# Patient Record
Sex: Female | Born: 1965
Health system: Southern US, Community
[De-identification: ages and names within clinical notes are randomized; demographics above are authoritative.]

## PROBLEM LIST (undated history)

## (undated) DIAGNOSIS — R011 Cardiac murmur, unspecified: Secondary | ICD-10-CM

## (undated) DIAGNOSIS — T883XXA Malignant hyperthermia due to anesthesia, initial encounter: Secondary | ICD-10-CM

## (undated) DIAGNOSIS — K219 Gastro-esophageal reflux disease without esophagitis: Secondary | ICD-10-CM

## (undated) HISTORY — DX: Cardiac murmur, unspecified: R01.1

## (undated) HISTORY — DX: Malignant hyperthermia due to anesthesia, initial encounter: T88.3XXA

## (undated) HISTORY — PX: BUNIONECTOMY: SHX129

---

## 1998-01-04 ENCOUNTER — Other Ambulatory Visit: Admission: RE | Admit: 1998-01-04 | Discharge: 1998-01-04 | Payer: Self-pay | Admitting: *Deleted

## 1999-09-10 ENCOUNTER — Other Ambulatory Visit: Admission: RE | Admit: 1999-09-10 | Discharge: 1999-09-10 | Payer: Self-pay | Admitting: *Deleted

## 2000-09-10 ENCOUNTER — Other Ambulatory Visit: Admission: RE | Admit: 2000-09-10 | Discharge: 2000-09-10 | Payer: Self-pay | Admitting: *Deleted

## 2001-10-11 ENCOUNTER — Other Ambulatory Visit: Admission: RE | Admit: 2001-10-11 | Discharge: 2001-10-11 | Payer: Self-pay | Admitting: *Deleted

## 2002-10-13 ENCOUNTER — Other Ambulatory Visit: Admission: RE | Admit: 2002-10-13 | Discharge: 2002-10-13 | Payer: Self-pay | Admitting: *Deleted

## 2003-12-26 ENCOUNTER — Ambulatory Visit (HOSPITAL_BASED_OUTPATIENT_CLINIC_OR_DEPARTMENT_OTHER): Admission: RE | Admit: 2003-12-26 | Discharge: 2003-12-26 | Payer: Self-pay | Admitting: Orthopedic Surgery

## 2004-04-09 ENCOUNTER — Other Ambulatory Visit: Admission: RE | Admit: 2004-04-09 | Discharge: 2004-04-09 | Payer: Self-pay | Admitting: Obstetrics and Gynecology

## 2010-01-31 ENCOUNTER — Encounter
Admission: RE | Admit: 2010-01-31 | Discharge: 2010-01-31 | Payer: Self-pay | Source: Home / Self Care | Attending: Obstetrics and Gynecology | Admitting: Obstetrics and Gynecology

## 2010-07-12 NOTE — Op Note (Signed)
[name], Gonzalez              ACCOUNT NO.:  [id number]   MEDICAL RECORD NO.:  [id number]          PATIENT TYPE:  AMB   LOCATION:  DSC                          FACILITY:  [facility]   PHYSICIAN:  Leonides Grills, M.D.     DATE OF BIRTH:  1965-11-09   DATE OF PROCEDURE:  12/26/2003  DATE OF DISCHARGE:                                 OPERATIVE REPORT   PREOPERATIVE DIAGNOSIS:  Bilateral hallux valgus.   POSTOPERATIVE DIAGNOSIS:  Bilateral hallux valgus.   OPERATION:  1.  Bilateral Chevron bunionectomies.  2.  Stress x-rays, bilateral feet.   ANESTHESIA:  General endotracheal.   SURGEON:  Leonides Grills, M.D.   ASSISTANT:  Vallarie Mare, P.A.-C.   ESTIMATED BLOOD LOSS:  Minimal.   TOURNIQUET TIME:  Approximately one-half an hour per side.   COMPLICATIONS:  None.   DISPOSITION:  Stable to the PAR.   INDICATIONS FOR PROCEDURE:  This is a 45 year old female who has had  longstanding progressive great toe pain that was interfering with her life.  __________ despite wearing wider toe-box shoes and taking intermittent anti-  inflammatories.  She has consented for the above procedure.  All risks,  which include infection, nerve or vessel injury, nonunion, malunion,  hardware irritation, hardware failure, recurrence of deformity, stiffness,  arthritis, avascular necrosis of the head, and possible fusion were all  explained.  Questions were encouraged and answered.   DESCRIPTION OF PROCEDURE:  The patient is brought to the operating room and  placed in the supine position.  After adequate general endotracheal tube  anesthesia was administered, as well as Ancef 1 g IV piggyback.  The  bilateral lower extremities were prepped and draped in a sterile manner over  a proximally-placed thigh tourniquet.  We started with the right side and  gravity-exsanguinated the right lower extremity.  The tourniquet was  elevated to 290 mmHg.  A longitudinal incision was then made over the great  toe MTP  joint.  Dissection was carried down through the skin.  Hemostasis  was obtained.  The neurovascular structures were identified both superiorly  and inferiorly and protected throughout the case.  An L-shaped capsulotomy  was then made.  A simple bunionectomy was then performed.  The lateral  capsule was then released with a curved Beaver blade.  A Chevron osteotomy  was then created.  The head was then translated approximately 3 mm to 4 mm  laterally.  This was then fixed with a 2.0 mm fully-threaded cortical set  screw using a 1.5 mm drill hole respectively.  The head was counter-sunk.  This had excellent purchase and maintenance of the correction.  Redundant  bone was then removed with the sagittal saw.  The Rocky Link Johnson's ridge was  then rounded off with the rongeur.  The area was copiously irrigated with  normal saline, including the joint.  The capsule was then advanced both  superiorly and proximally and affixed with #2-0 Vicryl suture.  The  tourniquet was deflated.  Hemostasis was obtained.  The skin was closed with  #4-0 nylon sutures.  A sterile dressing was applied.  A Roger Mann dressing  was applied.   We then performed the same exact procedure on the left side, as described on  the right.  Once the left side was finished and dressings were applied, the  H[place]ell  shoes were applied.  Prior to dressings, also two stress x-rays  were obtained in the AP and lateral planes with the mini-C-arm which showed  excellent placement of fixation, as well as correction, excellent  translation of sesamoids with range of motion as well.  No gross motion at  the osteotomy site, with adequate correction.  After the stress x-rays were  obtained, dressings were applied.  The patient was stable to the PAR.       PB/MEDQ  D:  12/26/2003  T:  12/26/2003  Job:  [id number]

## 2013-03-16 ENCOUNTER — Emergency Department (HOSPITAL_BASED_OUTPATIENT_CLINIC_OR_DEPARTMENT_OTHER)
Admission: EM | Admit: 2013-03-16 | Discharge: 2013-03-16 | Disposition: A | Discharge status: Home / Self Care | Payer: 59 | Attending: Emergency Medicine | Admitting: Emergency Medicine

## 2013-03-16 ENCOUNTER — Encounter (HOSPITAL_BASED_OUTPATIENT_CLINIC_OR_DEPARTMENT_OTHER): Payer: Self-pay | Admitting: Emergency Medicine

## 2013-03-16 DIAGNOSIS — R011 Cardiac murmur, unspecified: Secondary | ICD-10-CM | POA: Insufficient documentation

## 2013-03-16 DIAGNOSIS — R42 Dizziness and giddiness: Secondary | ICD-10-CM | POA: Insufficient documentation

## 2013-03-16 DIAGNOSIS — Z79899 Other long term (current) drug therapy: Secondary | ICD-10-CM | POA: Insufficient documentation

## 2013-03-16 DIAGNOSIS — R002 Palpitations: Secondary | ICD-10-CM | POA: Insufficient documentation

## 2013-03-16 DIAGNOSIS — K219 Gastro-esophageal reflux disease without esophagitis: Secondary | ICD-10-CM | POA: Insufficient documentation

## 2013-03-16 HISTORY — DX: Gastro-esophageal reflux disease without esophagitis: K21.9

## 2013-03-16 LAB — CBC WITH DIFFERENTIAL/PLATELET
BASOS ABS: 0 10*3/uL (ref 0.0–0.1)
BASOS PCT: 0 % (ref 0–1)
EOS ABS: 0.3 10*3/uL (ref 0.0–0.7)
EOS PCT: 3 % (ref 0–5)
HCT: 38.4 % (ref 36.0–46.0)
HEMOGLOBIN: 12.7 g/dL (ref 12.0–15.0)
LYMPHS PCT: 27 % (ref 12–46)
Lymphs Abs: 2.7 10*3/uL (ref 0.7–4.0)
MCH: 29.3 pg (ref 26.0–34.0)
MCHC: 33.1 g/dL (ref 30.0–36.0)
MCV: 88.7 fL (ref 78.0–100.0)
MONO ABS: 1 10*3/uL (ref 0.1–1.0)
Monocytes Relative: 10 % (ref 3–12)
Neutro Abs: 6.2 10*3/uL (ref 1.7–7.7)
Neutrophils Relative %: 61 % (ref 43–77)
Platelets: 311 10*3/uL (ref 150–400)
RBC: 4.33 MIL/uL (ref 3.87–5.11)
RDW: 11.7 % (ref 11.5–15.5)
WBC: 10.2 10*3/uL (ref 4.0–10.5)

## 2013-03-16 LAB — URINALYSIS, ROUTINE W REFLEX MICROSCOPIC
BILIRUBIN URINE: NEGATIVE
Glucose, UA: NEGATIVE mg/dL
KETONES UR: NEGATIVE mg/dL
LEUKOCYTES UA: NEGATIVE
NITRITE: NEGATIVE
PROTEIN: NEGATIVE mg/dL
SPECIFIC GRAVITY, URINE: 1.021 (ref 1.005–1.030)
Urobilinogen, UA: 0.2 mg/dL (ref 0.0–1.0)
pH: 5.5 (ref 5.0–8.0)

## 2013-03-16 LAB — BASIC METABOLIC PANEL
BUN: 18 mg/dL (ref 6–23)
CO2: 25 meq/L (ref 19–32)
Calcium: 9.3 mg/dL (ref 8.4–10.5)
Chloride: 101 mEq/L (ref 96–112)
Creatinine, Ser: 0.7 mg/dL (ref 0.50–1.10)
GFR calc Af Amer: >90 mL/min (ref >90)
GFR calc non Af Amer: >90 mL/min (ref >90)
GLUCOSE: 117 mg/dL — AB (ref 70–99)
POTASSIUM: 3.9 meq/L (ref 3.7–5.3)
SODIUM: 141 meq/L (ref 137–147)

## 2013-03-16 LAB — URINE MICROSCOPIC-ADD ON

## 2013-03-16 LAB — TROPONIN I: Troponin I: <0.3 ng/mL (ref <0.30)

## 2013-03-16 NOTE — ED Notes (Signed)
C/o heart palpitations that started after she woke from sleep around 12 am, denies any CP or SOB, denies diaphoresis.

## 2013-03-16 NOTE — Discharge Instructions (Signed)
Palpitations  A palpitation is the feeling that your heartbeat is irregular or is faster than normal. It may feel like your heart is fluttering or skipping a beat. Palpitations are usually not a serious problem. However, in some cases, you may need further medical evaluation. CAUSES  Palpitations can be caused by:  Smoking.  Caffeine or other stimulants, such as diet pills or energy drinks.  Alcohol.  Stress and anxiety.  Strenuous physical activity.  Fatigue.  Certain medicines.  Heart disease, especially if you have a history of arrhythmias. This includes atrial fibrillation, atrial flutter, or supraventricular tachycardia.  An improperly working pacemaker or defibrillator. DIAGNOSIS  To find the cause of your palpitations, your caregiver will take your history and perform a physical exam. Tests may also be done, including:  Electrocardiography (ECG). This test records the heart's electrical activity.  Cardiac monitoring. This allows your caregiver to monitor your heart rate and rhythm in real time.  Holter monitor. This is a portable device that records your heartbeat and can help diagnose heart arrhythmias. It allows your caregiver to track your heart activity for several days, if needed.  Stress tests by exercise or by giving medicine that makes the heart beat faster. TREATMENT  Treatment of palpitations depends on the cause of your symptoms and can vary greatly. Most cases of palpitations do not require any treatment other than time, relaxation, and monitoring your symptoms. Other causes, such as atrial fibrillation, atrial flutter, or supraventricular tachycardia, usually require further treatment. HOME CARE INSTRUCTIONS   Avoid:  Caffeinated coffee, tea, soft drinks, diet pills, and energy drinks.  Chocolate.  Alcohol.  Stop smoking if you smoke.  Reduce your stress and anxiety. Things that can help you relax include:  A method that measures bodily functions so  you can learn to control them (biofeedback).  Yoga.  Meditation.  Physical activity such as swimming, jogging, or walking.  Get plenty of rest and sleep. SEEK MEDICAL CARE IF:   You continue to have a fast or irregular heartbeat beyond 24 hours.  Your palpitations occur more often. SEEK IMMEDIATE MEDICAL CARE IF:  You develop chest pain or shortness of breath.  You have a severe headache.  You feel dizzy, or you faint. MAKE SURE YOU:  Understand these instructions.  Will watch your condition.  Will get help right away if you are not doing well or get worse. Document Released: 02/08/2000 Document Revised: 06/07/2012 Document Reviewed: 04/11/2011 [facility] Patient Information 2014 [place].  Cardiac Event Monitoring A cardiac event monitor is a small recording device used to help detect abnormal heart rhythms (arrhythmias). The monitor is used to record heart rhythm when noticeable symptoms such as the following occur:  Fast heart beats (palpitations), such as heart racing or fluttering.  Dizziness.  Fainting or lightheadedness.  Unexplained weakness. The monitor is wired to two electrodes placed on your chest. Electrodes are flat, sticky disks that attach to your skin. The monitor can be worn for up to 30 days. You will wear the monitor at all times, except when bathing.  HOW TO USE YOUR CARDIAC EVENT MONITOR A technician will prepare your chest for the electrode placement. The technician will show you how to place the electrodes, how to work the monitor, and how to replace the batteries. Take time to practice using the monitor before you leave the office. Make sure you understand how to send the information from the monitor to your health care provider. This requires a telephone with a landline,  not a cellphone. You need to:  Wear your monitor at all times, except when you are in water:  Do not get the monitor wet.  Take the monitor off when bathing. Do not swim  or use a hot tub with it on.  Keep your skin clean. Do not put body lotion or moisturizer on your chest.  Change the electrodes daily or any time they stop sticking to your skin. You might need to use tape to keep them on.  It is possible that your skin under the electrodes could become irritated. To keep this from happening, try to put the electrodes in slightly different places on your chest. However, they must remain in the area under your left breast and in the upper right section of your chest.  Make sure the monitor is safely clipped to your clothing or in a location close to your body that your health care provider recommends.  Press the button to record when you feel symptoms of heart trouble, such as dizziness, weakness, lightheadedness, palpitations, thumping, shortness of breath, unexplained weakness, or a fluttering or racing heart. The monitor is always on and records what happened slightly before you pressed the button, so do not worry about being too late to get good information.  Keep a diary of your activities, such as walking, doing chores, and taking medicine. It is especially important to note what you were doing when you pushed the button to record your symptoms. This will help your health care provider determine what might be contributing to your symptoms. The information stored in your monitor will be reviewed by your health care provider alongside your diary entries.  Send the recorded information as recommended by your health care provider. It is important to understand that it will take some time for your health care provider to process the results.  Change the batteries as recommended by your health care provider. SEEK IMMEDIATE MEDICAL CARE IF:   You have chest pain.  You have extreme difficulty breathing or shortness of breath.  You develop a very fast heartbeat that persists.  You develop dizziness that does not go away .  You faint or constantly feel you are  about to faint. Document Released: 11/20/2007 Document Revised: 10/13/2012 Document Reviewed: 08/09/2012 [facility] Patient Information 2014 [place], [place].

## 2013-03-16 NOTE — ED Provider Notes (Signed)
CSN: [id number]     Arrival date & time 03/16/13  0050 History   First MD Initiated Contact with Patient 03/16/13 0113     Chief Complaint  Patient presents with  . Palpitations   (Consider location/radiation/quality/duration/timing/severity/associated sxs/prior Treatment) HPI Comments: Patient is a 48 y/o female who present to the ED with cc of palpitations. Pt states that after she woke up around 12:30 am to use the restroom, she started having some palpitations and rapid heart beat. Her symptoms lasted for about 5 minutes and she had associated dizziness. She denies chest pain, dib, syncope. No hx of same in the past, and patient has no hx of CAD, PE and no true risk factors for the either of those 2 conditions. She denis any stimulant use, OTC supplements, smoking. Pt's symptoms have not recurred since they subsided.   Patient is a 48 y.o. female presenting with palpitations. The history is provided by the patient.  Palpitations Associated symptoms: dizziness   Associated symptoms: no chest pain and no shortness of breath     Past Medical History  Diagnosis Date  . GERD (gastroesophageal reflux disease)    History reviewed. No pertinent past surgical history. History reviewed. No pertinent family history. History  Substance Use Topics  . Smoking status: Never Smoker   . Smokeless tobacco: Not on file  . Alcohol Use: No   OB History   Grav Para Term Preterm Abortions TAB SAB Ect Mult Living                 Review of Systems  Constitutional: Positive for activity change.  Respiratory: Negative for shortness of breath.   Cardiovascular: Positive for palpitations. Negative for chest pain.  Gastrointestinal: Negative for abdominal pain.  Genitourinary: Negative for dysuria.  Neurological: Positive for dizziness. Negative for syncope.  All other systems reviewed and are negative.    Allergies  Review of patient's allergies indicates no known allergies.  Home Medications    Current Outpatient Rx  Name  Route  Sig  Dispense  Refill  . pantoprazole (PROTONIX) 40 MG tablet   Oral   Take 40 mg by mouth daily.          BP 152/58  Pulse 72  Temp(Src) 98.5 F (36.9 C) (Oral)  Resp 16  Ht 5' (1.524 m)  Wt 125 lb (56.7 kg)  BMI 24.41 kg/m2  SpO2 100%  LMP 03/07/2013 Physical Exam  Nursing note and vitals reviewed. Constitutional: She appears well-developed.  HENT:  Head: Atraumatic.  Eyes: Conjunctivae are normal.  Neck: Neck supple.  Cardiovascular: Normal rate.   Murmur heard. Pulmonary/Chest: Effort normal and breath sounds normal. No respiratory distress.  Abdominal: Soft. She exhibits no mass.  Neurological: She is alert.  Skin: Skin is warm.    ED Course  Procedures (including critical care time) Labs Review Labs Reviewed  BASIC METABOLIC PANEL - Abnormal; Notable for the following:    Glucose, Bld 117 (*)    All other components within normal limits  URINALYSIS, ROUTINE W REFLEX MICROSCOPIC - Abnormal; Notable for the following:    Hgb urine dipstick MODERATE (*)    All other components within normal limits  CBC WITH DIFFERENTIAL  TROPONIN I  URINE MICROSCOPIC-ADD ON   Imaging Review No results found.  EKG Interpretation   None       MDM   1. Heart palpitations     Date: 03/16/2013  Rate: 70  Rhythm: normal sinus rhythm  QRS Axis: normal  Intervals: normal  ST/T Wave abnormalities: normal  Conduction Disutrbances: none  Narrative Interpretation: unremarkable  Pt comes in with cc of palpitations. She has no Cardiac hx, and is symptoms free at the time of the evaluation. Additionally, there is no hx of PE, DVT and risk factors for the same, no stimulant use, no drug abuse. Will check basic labs. Suspect PSVT at this time. PCP f/u will be requested.    Varney Biles, MD 03/16/13 [phone]

## 2013-03-16 NOTE — ED Notes (Signed)
No old EKG found  

## 2013-09-13 ENCOUNTER — Other Ambulatory Visit: Payer: Self-pay | Admitting: Obstetrics and Gynecology

## 2013-09-13 DIAGNOSIS — R928 Other abnormal and inconclusive findings on diagnostic imaging of breast: Secondary | ICD-10-CM

## 2013-09-19 ENCOUNTER — Ambulatory Visit
Admission: RE | Admit: 2013-09-19 | Discharge: 2013-09-19 | Disposition: A | Discharge status: Home / Self Care | Payer: 59 | Source: Ambulatory Visit | Attending: Obstetrics and Gynecology | Admitting: Obstetrics and Gynecology

## 2013-09-19 DIAGNOSIS — R928 Other abnormal and inconclusive findings on diagnostic imaging of breast: Secondary | ICD-10-CM

## 2013-12-09 ENCOUNTER — Other Ambulatory Visit: Payer: Self-pay

## 2015-04-09 MED FILL — PANTOPRAZOLE SOD DR 40 MG T: 40 | 90 days supply | Qty: 90 | Fill #3

## 2015-07-17 MED FILL — PANTOPRAZOLE SOD DR 40 MG T: 40 | 90 days supply | Qty: 90 | Fill #0

## 2015-07-18 DIAGNOSIS — Z0000 Encounter for general adult medical examination without abnormal findings: Secondary | ICD-10-CM | POA: Diagnosis not present

## 2015-07-18 DIAGNOSIS — K219 Gastro-esophageal reflux disease without esophagitis: Secondary | ICD-10-CM | POA: Diagnosis not present

## 2015-08-22 ENCOUNTER — Encounter (HOSPITAL_BASED_OUTPATIENT_CLINIC_OR_DEPARTMENT_OTHER): Payer: Self-pay

## 2015-08-22 ENCOUNTER — Emergency Department (HOSPITAL_BASED_OUTPATIENT_CLINIC_OR_DEPARTMENT_OTHER)
Admission: EM | Admit: 2015-08-22 | Discharge: 2015-08-23 | Disposition: A | Discharge status: Home / Self Care | Payer: 59 | Attending: Emergency Medicine | Admitting: Emergency Medicine

## 2015-08-22 ENCOUNTER — Emergency Department (HOSPITAL_BASED_OUTPATIENT_CLINIC_OR_DEPARTMENT_OTHER): Payer: 59

## 2015-08-22 DIAGNOSIS — R0789 Other chest pain: Secondary | ICD-10-CM | POA: Diagnosis present

## 2015-08-22 DIAGNOSIS — K219 Gastro-esophageal reflux disease without esophagitis: Secondary | ICD-10-CM | POA: Diagnosis not present

## 2015-08-22 DIAGNOSIS — Z79899 Other long term (current) drug therapy: Secondary | ICD-10-CM | POA: Insufficient documentation

## 2015-08-22 DIAGNOSIS — R079 Chest pain, unspecified: Secondary | ICD-10-CM | POA: Diagnosis not present

## 2015-08-22 LAB — CBC
HCT: 40 % (ref 36.0–46.0)
Hemoglobin: 13.5 g/dL (ref 12.0–15.0)
MCH: 29.3 pg (ref 26.0–34.0)
MCHC: 33.8 g/dL (ref 30.0–36.0)
MCV: 87 fL (ref 78.0–100.0)
PLATELETS: 303 10*3/uL (ref 150–400)
RBC: 4.6 MIL/uL (ref 3.87–5.11)
RDW: 12.2 % (ref 11.5–15.5)
WBC: 8.9 10*3/uL (ref 4.0–10.5)

## 2015-08-22 LAB — BASIC METABOLIC PANEL
Anion gap: 8 (ref 5–15)
BUN: 15 mg/dL (ref 6–20)
CALCIUM: 9.4 mg/dL (ref 8.9–10.3)
CHLORIDE: 104 mmol/L (ref 101–111)
CO2: 26 mmol/L (ref 22–32)
CREATININE: 0.81 mg/dL (ref 0.44–1.00)
GFR calc non Af Amer: >60 mL/min (ref >60)
GLUCOSE: 160 mg/dL — AB (ref 65–99)
Potassium: 3.7 mmol/L (ref 3.5–5.1)
Sodium: 138 mmol/L (ref 135–145)

## 2015-08-22 LAB — TROPONIN I

## 2015-08-22 MED ORDER — GI COCKTAIL ~~LOC~~
30.0000 mL | Freq: Once | ORAL | Status: AC
  Administered 2015-08-22: 30 mL via ORAL
  Filled 2015-08-22: qty 30

## 2015-08-22 NOTE — ED Notes (Signed)
Patient transported to X-ray 

## 2015-08-22 NOTE — ED Notes (Signed)
CP started yesterday-NAD-steady gait

## 2015-08-22 NOTE — ED Notes (Signed)
Pt c/o constant midline chest burning since last night.  She states she felt like it was heart burn, but she tried antacids without relief and she did not sleep at all last night.  She says that tonight she had sudden onset of nausea which she has never had with her heart burn before.  Pt does not have any health problems, she denies any other associated symptoms.

## 2015-08-22 NOTE — ED Notes (Signed)
Pt returned from xray

## 2015-08-22 NOTE — ED Provider Notes (Signed)
CSN: [id number]     Arrival date & time 08/22/15  2233 History  By signing my name below, I, Soijett Blue, attest that this documentation has been prepared under the direction and in the presence of Merryl Hacker, MD. Electronically Signed: Soijett Blue, ED Scribe. 08/22/2015. 11:38 PM.   Chief Complaint  Patient presents with  . Chest Pain      The history is provided by the patient. No language interpreter was used.    HPI Comments: Michaela Gonzalez is a 50 y.o. female with a medical hx of GERD who presents to the Emergency Department complaining of 4/10, constant, burning, sternal, non-radiating, CP onset last night. Pt notes that her reflux is worse after eating late at night. Pain has been constant since yesterday. Pt states that she takes 40 mg protonix daily for her GERD. She notes that she was eating dinner tonight when she had sudden onset of nausea. THis was new. She states that she has tried Mylanta with her last dose at 6:30 PM tonight with no relief for her symptoms. She denies vomiting and any other symptoms. Denies PMHx of DM, HTN, or heart dx. Denies any major medical issues. Pt notes that she does still have her gallbladder. Denies allergies to any medications. Pt notes that she has an appointment with GI specialist in 2 weeks for a colonoscopy.    Past Medical History  Diagnosis Date  . GERD (gastroesophageal reflux disease)    History reviewed. No pertinent past surgical history. No family history on file. Social History  Substance Use Topics  . Smoking status: Never Smoker   . Smokeless tobacco: None  . Alcohol Use: Yes     Comment: rare   OB History    No data available     Review of Systems  Constitutional: Negative for fever.  Cardiovascular: Positive for chest pain.  Gastrointestinal: Positive for nausea. Negative for vomiting and abdominal pain.  All other systems reviewed and are negative.   Allergies  Review of patient's allergies indicates no  known allergies.  Home Medications   Prior to Admission medications   Medication Sig Start Date End Date Taking? Authorizing Provider  famotidine (PEPCID) 20 MG tablet Take 1 tablet (20 mg total) by mouth 2 (two) times daily. 08/23/15   Merryl Hacker, MD  pantoprazole (PROTONIX) 40 MG tablet Take 40 mg by mouth daily.    Historical Provider, MD   BP 129/63 mmHg  Pulse 57  Temp(Src) 98.5 F (36.9 C) (Oral)  Resp 16  Ht 5' (1.524 m)  Wt 127 lb (57.607 kg)  BMI 24.80 kg/m2  SpO2 96% Physical Exam  Constitutional: She is oriented to person, place, and time. She appears well-developed and well-nourished. No distress.  HENT:  Head: Normocephalic and atraumatic.  Cardiovascular: Normal rate, regular rhythm and normal heart sounds.   Pulmonary/Chest: Effort normal. No respiratory distress. She has no wheezes.  Abdominal: Soft. Bowel sounds are normal. There is no tenderness. There is no rebound.  Neurological: She is alert and oriented to person, place, and time.  Skin: Skin is warm and dry.  Psychiatric: She has a normal mood and affect.  Nursing note and vitals reviewed.   ED Course  Procedures (including critical care time) DIAGNOSTIC STUDIES: Oxygen Saturation is 100% on RA, nl by my interpretation.    COORDINATION OF CARE: 11:36 PM Discussed treatment plan with pt at bedside which includes CXR, labs, EKG, and pt agreed to plan.   Labs  Review Labs Reviewed  BASIC METABOLIC PANEL - Abnormal; Notable for the following:    Glucose, Bld 160 (*)    All other components within normal limits  HEPATIC FUNCTION PANEL - Abnormal; Notable for the following:    Bilirubin, Direct <0.1 (*)    All other components within normal limits  CBC  TROPONIN I  LIPASE, BLOOD    Imaging Review Dg Chest 2 View  08/22/2015  CLINICAL DATA:  Midsternal chest pain for 2 days. Nausea today appear EXAM: CHEST  2 VIEW COMPARISON:  07/02/2012 FINDINGS: The heart size and mediastinal contours are  within normal limits. Both lungs are clear. The visualized skeletal structures are unremarkable. IMPRESSION: No active cardiopulmonary disease. Electronically Signed   By: Andreas Newport M.D.   On: 08/22/2015 23:13   I have personally reviewed and evaluated these images and lab results as part of my medical decision-making.   EKG Interpretation   Date/Time:  Wednesday August 22 2015 22:46:25 EDT Ventricular Rate:  72 PR Interval:  128 QRS Duration: 86 QT Interval:  402 QTC Calculation: 440 R Axis:   78 Text Interpretation:  Normal sinus rhythm Normal ECG Confirmed by HORTON   MD, COURTNEY ([id number]) on 08/22/2015 10:56:53 PM      MDM   Final diagnoses:  Gastroesophageal reflux disease, esophagitis presence not specified    Patient presents with chest pain. Onset of symptoms 24 hours ago. It is similar to her known heartburn; however, she reports nausea today which is new. It has been constant. Very atypical for ACS. EKG is normal.  Workup initiated. Patient was given a GI cocktail. Troponin, LFTs, lipase are normal. Patient had improvement with a GI cocktail. Given history, feel this is likely related to the patient's known GERD. She has a GI appointment in 2 weeks to schedule a colonoscopy. I encouraged her to discuss with GI and upper endoscopy as well. Given that her symptoms are somewhat worse, will initiate an H2 blocker in addition to her PPI at home.  She should be reevaluated if she develops bloody emesis or bloody stools. Patient stated understanding.  After history, exam, and medical workup I feel the patient has been appropriately medically screened and is safe for discharge home. Pertinent diagnoses were discussed with the patient. Patient was given return precautions.  I personally performed the services described in this documentation, which was scribed in my presence. The recorded information has been reviewed and is accurate.    Merryl Hacker, MD 08/23/15 [phone]

## 2015-08-23 LAB — HEPATIC FUNCTION PANEL
ALK PHOS: 90 U/L (ref 38–126)
ALT: 17 U/L (ref 14–54)
AST: 22 U/L (ref 15–41)
Albumin: 4.4 g/dL (ref 3.5–5.0)
Bilirubin, Direct: <0.1 mg/dL — ABNORMAL LOW (ref 0.1–0.5)
TOTAL PROTEIN: 7.5 g/dL (ref 6.5–8.1)
Total Bilirubin: 0.5 mg/dL (ref 0.3–1.2)

## 2015-08-23 LAB — LIPASE, BLOOD: Lipase: 33 U/L (ref 11–51)

## 2015-08-23 MED ORDER — FAMOTIDINE 20 MG PO TABS
20.0000 mg | ORAL_TABLET | Freq: Two times a day (BID) | ORAL | Status: AC
Start: 2015-08-23 — End: ?

## 2015-08-23 NOTE — Discharge Instructions (Signed)

## 2015-08-23 NOTE — ED Notes (Signed)
Pt verbalizes understanding of d/c instructions and denies any further needs at this time. 

## 2015-08-24 MED FILL — FAMOTIDINE 20 MG TABLET: 20 | 15 days supply | Qty: 30 | Fill #0

## 2015-09-07 DIAGNOSIS — K219 Gastro-esophageal reflux disease without esophagitis: Secondary | ICD-10-CM | POA: Diagnosis not present

## 2015-10-17 MED FILL — PANTOPRAZOLE SOD DR 40 MG T: 40 | 90 days supply | Qty: 90 | Fill #0

## 2015-10-18 ENCOUNTER — Telehealth: Payer: Self-pay | Admitting: Gastroenterology

## 2015-10-19 DIAGNOSIS — Z1231 Encounter for screening mammogram for malignant neoplasm of breast: Secondary | ICD-10-CM | POA: Diagnosis not present

## 2015-10-19 DIAGNOSIS — R0981 Nasal congestion: Secondary | ICD-10-CM | POA: Diagnosis not present

## 2015-10-19 DIAGNOSIS — Z01419 Encounter for gynecological examination (general) (routine) without abnormal findings: Secondary | ICD-10-CM | POA: Diagnosis not present

## 2015-10-19 DIAGNOSIS — H9319 Tinnitus, unspecified ear: Secondary | ICD-10-CM | POA: Diagnosis not present

## 2015-10-19 DIAGNOSIS — Z6825 Body mass index (BMI) 25.0-25.9, adult: Secondary | ICD-10-CM | POA: Diagnosis not present

## 2015-10-19 DIAGNOSIS — Z780 Asymptomatic menopausal state: Secondary | ICD-10-CM | POA: Diagnosis not present

## 2015-10-19 MED FILL — FLUTICASONE PROP 50 MCG SPR: 50 | 60 days supply | Qty: 16 | Fill #0

## 2015-10-22 ENCOUNTER — Encounter: Payer: Self-pay | Admitting: Gastroenterology

## 2015-10-22 NOTE — Telephone Encounter (Signed)
Dr. Loletha Carrow reviewed records and has accepted patient. Ok to schedule Direct EGD/Colon. Left message for patient to return my call

## 2015-10-22 NOTE — Telephone Encounter (Signed)
EGD and colon has been scheduled.

## 2015-10-23 ENCOUNTER — Other Ambulatory Visit: Payer: Self-pay | Admitting: Obstetrics and Gynecology

## 2015-10-23 DIAGNOSIS — R928 Other abnormal and inconclusive findings on diagnostic imaging of breast: Secondary | ICD-10-CM

## 2015-10-24 DIAGNOSIS — R05 Cough: Secondary | ICD-10-CM | POA: Diagnosis not present

## 2015-10-24 DIAGNOSIS — J329 Chronic sinusitis, unspecified: Secondary | ICD-10-CM | POA: Diagnosis not present

## 2015-10-24 MED FILL — HYDROCODONE-HOMATROPINE SYR: 5-1.5 | 6 days supply | Qty: 120 | Fill #0

## 2015-10-24 MED FILL — SULFAMETHOXAZOLE/TMP DS TAB: 800-160 | 10 days supply | Qty: 20 | Fill #0

## 2015-10-31 ENCOUNTER — Ambulatory Visit
Admission: RE | Admit: 2015-10-31 | Discharge: 2015-10-31 | Disposition: A | Discharge status: Home / Self Care | Payer: 59 | Source: Ambulatory Visit | Attending: Obstetrics and Gynecology | Admitting: Obstetrics and Gynecology

## 2015-10-31 DIAGNOSIS — R921 Mammographic calcification found on diagnostic imaging of breast: Secondary | ICD-10-CM | POA: Diagnosis not present

## 2015-10-31 DIAGNOSIS — R928 Other abnormal and inconclusive findings on diagnostic imaging of breast: Secondary | ICD-10-CM

## 2015-11-02 MED FILL — ZOLPIDEM TARTRATE 10 MG TAB: 10 | 30 days supply | Qty: 30 | Fill #0

## 2015-11-14 ENCOUNTER — Ambulatory Visit (AMBULATORY_SURGERY_CENTER): Payer: Self-pay | Admitting: *Deleted

## 2015-11-14 VITALS — Ht 60.25 in | Wt 129.0 lb

## 2015-11-14 DIAGNOSIS — Z1211 Encounter for screening for malignant neoplasm of colon: Secondary | ICD-10-CM

## 2015-11-14 DIAGNOSIS — K219 Gastro-esophageal reflux disease without esophagitis: Secondary | ICD-10-CM

## 2015-11-14 MED ORDER — NA SULFATE-K SULFATE-MG SULF 17.5-3.13-1.6 GM/177ML PO SOLN: 1.0000 | Freq: Once | ORAL | 0 refills | Status: AC

## 2015-11-14 NOTE — Progress Notes (Signed)
No egg or soy allergy known to patient  No issues with past sedation with any surgeries  or procedures, no intubation problems  No diet pills per patient No home 02 use per patient  No blood thinners per patient  Pt denies issues with constipation  No A fib or A flutter   emmi declined'   

## 2015-11-19 MED FILL — SUPREP BOWEL PREP KIT: 17.5-3.13-1 | 1 days supply | Qty: 354 | Fill #0

## 2015-12-03 ENCOUNTER — Encounter: Payer: Self-pay | Admitting: Gastroenterology

## 2015-12-03 ENCOUNTER — Ambulatory Visit (AMBULATORY_SURGERY_CENTER): Payer: 59 | Admitting: Gastroenterology

## 2015-12-03 VITALS — BP 124/62 | HR 52 | Temp 99.3°F | Resp 15 | Ht 60.0 in | Wt 129.0 lb

## 2015-12-03 DIAGNOSIS — Z1211 Encounter for screening for malignant neoplasm of colon: Secondary | ICD-10-CM

## 2015-12-03 DIAGNOSIS — R12 Heartburn: Secondary | ICD-10-CM

## 2015-12-03 DIAGNOSIS — D122 Benign neoplasm of ascending colon: Secondary | ICD-10-CM | POA: Diagnosis not present

## 2015-12-03 DIAGNOSIS — Z1212 Encounter for screening for malignant neoplasm of rectum: Secondary | ICD-10-CM | POA: Diagnosis not present

## 2015-12-03 DIAGNOSIS — K219 Gastro-esophageal reflux disease without esophagitis: Secondary | ICD-10-CM | POA: Diagnosis not present

## 2015-12-03 MED ORDER — SODIUM CHLORIDE 0.9 % IV SOLN: 500.0000 mL | INTRAVENOUS | Status: AC

## 2015-12-03 NOTE — Op Note (Signed)
[place] Patient Name: Michaela Gonzalez Procedure Date: 12/03/2015 2:34 PM MRN: [id number] Endoscopist: Mallie Mussel L. Loletha Gonzalez , MD Age: 50 Referring MD:  Date of Birth: [birth year] Gender: Female Account #: [id number] Procedure:                Colonoscopy Indications:              Screening for colorectal malignant neoplasm, This                            is the patient's first colonoscopy Medicines:                Monitored Anesthesia Care Procedure:                Pre-Anesthesia Assessment:                           - Prior to the procedure, a History and Physical                            was performed, and patient medications and                            allergies were reviewed. The patient's tolerance of                            previous anesthesia was also reviewed. The risks                            and benefits of the procedure and the sedation                            options and risks were discussed with the patient.                            All questions were answered, and informed consent                            was obtained. Prior Anticoagulants: The patient has                            taken no previous anticoagulant or antiplatelet                            agents. ASA Grade Assessment: II - A patient with                            mild systemic disease. After reviewing the risks                            and benefits, the patient was deemed in                            satisfactory condition to undergo the procedure.  After obtaining informed consent, the colonoscope                            was passed under direct vision. Throughout the                            procedure, the patient's blood pressure, pulse, and                            oxygen saturations were monitored continuously. The                            Model CF-HQ190L [id number]) scope was introduced                            through the anus and  advanced to the the cecum,                            identified by appendiceal orifice and ileocecal                            valve. The colonoscopy was performed with                            difficulty due to a tortuous colon (splenic                            flexure). Successful completion of the procedure                            was aided by withdrawing the scope and replacing                            with the pediatric colonoscope. The patient                            tolerated the procedure well. The quality of the                            bowel preparation was excellent. The ileocecal                            valve, appendiceal orifice, and rectum were                            photographed. The quality of the bowel preparation                            was evaluated using the BBPS [facility] Bowel                            Preparation Scale) with scores of: Right Colon = 3,  Transverse Colon = 3 and Left Colon = 3 (entire                            mucosa seen well with no residual staining, small                            fragments of stool or opaque liquid). The total                            BBPS score equals 9. The bowel preparation used was                            SUPREP. Scope In: 2:52:07 PM Scope Out: 3:18:23 PM Scope Withdrawal Time: 0 hours 9 minutes 36 seconds  Total Procedure Duration: 0 hours 26 minutes 16 seconds  Findings:                 The perianal and digital rectal examinations were                            normal.                           A 4 mm polyp was found in the proximal ascending                            colon. The polyp was sessile. The polyp was removed                            with a cold snare. Resection and retrieval were                            complete.                           The exam was otherwise without abnormality on                            direct and retroflexion  views. Complications:            No immediate complications. Estimated Blood Loss:     Estimated blood loss: none. Impression:               - One 4 mm polyp in the proximal ascending colon,                            removed with a cold snare. Resected and retrieved.                           - The examination was otherwise normal on direct                            and retroflexion views. Recommendation:           - Patient has a contact number available for  emergencies. The signs and symptoms of potential                            delayed complications were discussed with the                            patient. Return to normal activities tomorrow.                            Written discharge instructions were provided to the                            patient.                           - Resume previous diet.                           - Continue present medications.                           - Await pathology results.                           - Repeat colonoscopy is recommended for                            surveillance. The colonoscopy date will be                            determined after pathology results from today's                            exam become available for review. Michaela Lerner L. Loletha Carrow, MD 12/03/2015 3:24:21 PM This report has been signed electronically.

## 2015-12-03 NOTE — Patient Instructions (Signed)
  HANDOUT GIVEN FOR ANTIREFLUX AND POLYPS.  YOU HAD AN ENDOSCOPIC PROCEDURE TODAY AT [facility]:   Refer to the procedure report that was given to you for any specific questions about what was found during the examination.  If the procedure report does not answer your questions, please call your gastroenterologist to clarify.  If you requested that your care partner not be given the details of your procedure findings, then the procedure report has been included in a sealed envelope for you to review at your convenience later.  YOU SHOULD EXPECT: Some feelings of bloating in the abdomen. Passage of more gas than usual.  Walking can help get rid of the air that was put into your GI tract during the procedure and reduce the bloating. If you had a lower endoscopy (such as a colonoscopy or flexible sigmoidoscopy) you may notice spotting of blood in your stool or on the toilet paper. If you underwent a bowel prep for your procedure, you may not have a normal bowel movement for a few days.  Please Note:  You might notice some irritation and congestion in your nose or some drainage.  This is from the oxygen used during your procedure.  There is no need for concern and it should clear up in a day or so.  SYMPTOMS TO REPORT IMMEDIATELY:   Following lower endoscopy (colonoscopy or flexible sigmoidoscopy):  Excessive amounts of blood in the stool  Significant tenderness or worsening of abdominal pains  Swelling of the abdomen that is new, acute  Fever of 100F or higher   Following upper endoscopy (EGD)  Vomiting of blood or coffee ground material  New chest pain or pain under the shoulder blades  Painful or persistently difficult swallowing  New shortness of breath  Fever of 100F or higher  Black, tarry-looking stools  For urgent or emergent issues, a gastroenterologist can be reached at any hour by calling [phone].   DIET:  We do recommend a small meal at first, but then  you may proceed to your regular diet.  Drink plenty of fluids but you should avoid alcoholic beverages for 24 hours.  ACTIVITY:  You should plan to take it easy for the rest of today and you should NOT DRIVE or use heavy machinery until tomorrow (because of the sedation medicines used during the test).    FOLLOW UP: Our staff will call the number listed on your records the next business day following your procedure to check on you and address any questions or concerns that you may have regarding the information given to you following your procedure. If we do not reach you, we will leave a message.  However, if you are feeling well and you are not experiencing any problems, there is no need to return our call.  We will assume that you have returned to your regular daily activities without incident.  If any biopsies were taken you will be contacted by phone or by letter within the next 1-3 weeks.  Please call us at [phone] if you have not heard about the biopsies in 3 weeks.    SIGNATURES/CONFIDENTIALITY: You and/or your care partner have signed paperwork which will be entered into your electronic medical record.  These signatures attest to the fact that that the information above on your After Visit Summary has been reviewed and is understood.  Full responsibility of the confidentiality of this discharge information lies with you and/or your care-partner.

## 2015-12-03 NOTE — Progress Notes (Signed)
Report to PACU, RN, vss, BBS= Clear.  

## 2015-12-03 NOTE — Op Note (Signed)
[place] Patient Name: Michaela Gonzalez Procedure Date: 12/03/2015 2:34 PM MRN: [id number] Endoscopist: Mallie Mussel L. Loletha Carrow , MD Age: 50 Referring MD:  Date of Birth: 11-Jan-1966 Gender: Female Account #: [id number] Procedure:                Upper GI endoscopy Indications:              Heartburn Medicines:                Monitored Anesthesia Care Procedure:                Pre-Anesthesia Assessment:                           - Prior to the procedure, a History and Physical                            was performed, and patient medications and                            allergies were reviewed. The patient's tolerance of                            previous anesthesia was also reviewed. The risks                            and benefits of the procedure and the sedation                            options and risks were discussed with the patient.                            All questions were answered, and informed consent                            was obtained. Prior Anticoagulants: The patient has                            taken no previous anticoagulant or antiplatelet                            agents. ASA Grade Assessment: II - A patient with                            mild systemic disease. After reviewing the risks                            and benefits, the patient was deemed in                            satisfactory condition to undergo the procedure.                           After obtaining informed consent, the endoscope was  passed under direct vision. Throughout the                            procedure, the patient's blood pressure, pulse, and                            oxygen saturations were monitored continuously. The                            Model GIF-HQ190 [id number]) scope was introduced                            through the mouth, and advanced to the second part                            of duodenum. The upper GI endoscopy was                          accomplished without difficulty. The patient                            tolerated the procedure well. Scope In: 2:52:49 PM Scope Out: 2:55:45 PM Total Procedure Duration: 0 hours 2 minutes 56 seconds  Findings:                 The esophagus was normal.                           The stomach was normal.                           The cardia and gastric fundus were normal on                            retroflexion.                           The examined duodenum was normal. Complications:            No immediate complications. Estimated Blood Loss:     Estimated blood loss: none. Impression:               - Normal esophagus.                           - Normal stomach.                           - Normal examined duodenum.                           - No specimens collected. Recommendation:           - Patient has a contact number available for                            emergencies. The signs and symptoms of potential  delayed complications were discussed with the                            patient. Return to normal activities tomorrow.                            Written discharge instructions were provided to the                            patient.                           - Resume previous diet.                           - Continue present medications.                           - Follow an antireflux regimen.                           - See the other procedure note for documentation of                            additional recommendations. Derrick Tiegs L. Loletha Carrow, MD 12/03/2015 3:21:35 PM This report has been signed electronically.

## 2015-12-03 NOTE — Progress Notes (Signed)
Called to room to assist during endoscopic procedure.  Patient ID and intended procedure confirmed with present staff. Received instructions for my participation in the procedure from the performing physician.  

## 2015-12-04 ENCOUNTER — Other Ambulatory Visit: Payer: Self-pay | Admitting: Obstetrics and Gynecology

## 2015-12-04 ENCOUNTER — Telehealth: Payer: Self-pay

## 2015-12-04 DIAGNOSIS — R921 Mammographic calcification found on diagnostic imaging of breast: Secondary | ICD-10-CM

## 2015-12-04 NOTE — Telephone Encounter (Signed)
  Follow up Call-  Call back number 12/03/2015  Post procedure Call Back phone  # [phone] cell  Permission to leave phone message Yes  Some recent data might be hidden     Patient questions:  Do you have a fever, pain , or abdominal swelling? No. Pain Score  0 *  Have you tolerated food without any problems? Yes.    Have you been able to return to your normal activities? Yes.    Do you have any questions about your discharge instructions: Diet   No. Medications  No. Follow up visit  No.  Do you have questions or concerns about your Care? No.  Actions: * If pain score is 4 or above: No action needed, pain <4.

## 2015-12-07 ENCOUNTER — Encounter: Payer: Self-pay | Admitting: Gastroenterology

## 2016-01-07 DIAGNOSIS — M79641 Pain in right hand: Secondary | ICD-10-CM | POA: Diagnosis not present

## 2016-01-07 DIAGNOSIS — M65341 Trigger finger, right ring finger: Secondary | ICD-10-CM | POA: Diagnosis not present

## 2016-01-28 MED FILL — PANTOPRAZOLE SOD DR 40 MG T: 40 | 90 days supply | Qty: 90 | Fill #1

## 2016-02-01 DIAGNOSIS — M65341 Trigger finger, right ring finger: Secondary | ICD-10-CM | POA: Diagnosis not present

## 2016-02-01 DIAGNOSIS — M79644 Pain in right finger(s): Secondary | ICD-10-CM | POA: Diagnosis not present

## 2016-02-08 DIAGNOSIS — H40013 Open angle with borderline findings, low risk, bilateral: Secondary | ICD-10-CM | POA: Diagnosis not present

## 2016-02-08 DIAGNOSIS — H04123 Dry eye syndrome of bilateral lacrimal glands: Secondary | ICD-10-CM | POA: Diagnosis not present

## 2016-04-02 ENCOUNTER — Other Ambulatory Visit: Payer: Self-pay | Admitting: Obstetrics and Gynecology

## 2016-04-02 ENCOUNTER — Other Ambulatory Visit: Payer: Self-pay | Admitting: Physician Assistant

## 2016-04-02 ENCOUNTER — Ambulatory Visit
Admission: RE | Admit: 2016-04-02 | Discharge: 2016-04-02 | Disposition: A | Discharge status: Home / Self Care | Payer: 59 | Source: Ambulatory Visit | Attending: Obstetrics and Gynecology | Admitting: Obstetrics and Gynecology

## 2016-04-02 ENCOUNTER — Ambulatory Visit
Admission: RE | Admit: 2016-04-02 | Discharge: 2016-04-02 | Disposition: A | Discharge status: Home / Self Care | Payer: 59 | Source: Ambulatory Visit | Attending: Physician Assistant | Admitting: Physician Assistant

## 2016-04-02 DIAGNOSIS — R921 Mammographic calcification found on diagnostic imaging of breast: Secondary | ICD-10-CM | POA: Diagnosis not present

## 2016-04-02 DIAGNOSIS — M79671 Pain in right foot: Secondary | ICD-10-CM | POA: Diagnosis not present

## 2016-04-02 DIAGNOSIS — S99921A Unspecified injury of right foot, initial encounter: Secondary | ICD-10-CM | POA: Diagnosis not present

## 2016-04-02 DIAGNOSIS — T1490XA Injury, unspecified, initial encounter: Secondary | ICD-10-CM

## 2016-05-08 MED FILL — PANTOPRAZOLE SOD DR 40 MG T: 40 | 90 days supply | Qty: 90 | Fill #2

## 2016-06-30 DIAGNOSIS — M65341 Trigger finger, right ring finger: Secondary | ICD-10-CM | POA: Diagnosis not present

## 2016-06-30 DIAGNOSIS — M79641 Pain in right hand: Secondary | ICD-10-CM | POA: Diagnosis not present

## 2016-07-28 DIAGNOSIS — Z0000 Encounter for general adult medical examination without abnormal findings: Secondary | ICD-10-CM | POA: Diagnosis not present

## 2016-07-28 DIAGNOSIS — K219 Gastro-esophageal reflux disease without esophagitis: Secondary | ICD-10-CM | POA: Diagnosis not present

## 2016-07-28 DIAGNOSIS — Z13220 Encounter for screening for lipoid disorders: Secondary | ICD-10-CM | POA: Diagnosis not present

## 2016-07-28 DIAGNOSIS — Z131 Encounter for screening for diabetes mellitus: Secondary | ICD-10-CM | POA: Diagnosis not present

## 2016-08-12 MED FILL — PANTOPRAZOLE SOD DR 40 MG T: 40 | 90 days supply | Qty: 90 | Fill #0

## 2016-08-20 ENCOUNTER — Other Ambulatory Visit: Payer: Self-pay | Admitting: Obstetrics and Gynecology

## 2016-08-20 DIAGNOSIS — R921 Mammographic calcification found on diagnostic imaging of breast: Secondary | ICD-10-CM

## 2016-10-29 ENCOUNTER — Ambulatory Visit
Admission: RE | Admit: 2016-10-29 | Discharge: 2016-10-29 | Disposition: A | Discharge status: Home / Self Care | Payer: 59 | Source: Ambulatory Visit | Attending: Obstetrics and Gynecology | Admitting: Obstetrics and Gynecology

## 2016-10-29 ENCOUNTER — Other Ambulatory Visit: Payer: Self-pay | Admitting: Obstetrics and Gynecology

## 2016-10-29 DIAGNOSIS — R921 Mammographic calcification found on diagnostic imaging of breast: Secondary | ICD-10-CM

## 2016-11-04 MED FILL — CEPHALEXIN 500 MG CAPSULE: 500 | 5 days supply | Qty: 20 | Fill #0

## 2016-11-06 DIAGNOSIS — Z6826 Body mass index (BMI) 26.0-26.9, adult: Secondary | ICD-10-CM | POA: Diagnosis not present

## 2016-11-06 DIAGNOSIS — Z01419 Encounter for gynecological examination (general) (routine) without abnormal findings: Secondary | ICD-10-CM | POA: Diagnosis not present

## 2016-11-19 MED FILL — PANTOPRAZOLE SOD DR 40 MG T: 40 | 90 days supply | Qty: 90 | Fill #1

## 2016-12-04 DIAGNOSIS — M65341 Trigger finger, right ring finger: Secondary | ICD-10-CM | POA: Diagnosis not present

## 2016-12-04 MED FILL — HYDROCODON-APAP 5-325: 5-325 | 3 days supply | Qty: 30 | Fill #0

## 2016-12-12 DIAGNOSIS — M79641 Pain in right hand: Secondary | ICD-10-CM | POA: Diagnosis not present

## 2016-12-12 DIAGNOSIS — M65341 Trigger finger, right ring finger: Secondary | ICD-10-CM | POA: Diagnosis not present

## 2017-01-31 DIAGNOSIS — M25512 Pain in left shoulder: Secondary | ICD-10-CM | POA: Diagnosis not present

## 2017-02-12 DIAGNOSIS — H524 Presbyopia: Secondary | ICD-10-CM | POA: Diagnosis not present

## 2017-02-20 MED FILL — PANTOPRAZOLE SOD DR 40 MG T: 40 | 90 days supply | Qty: 90 | Fill #2

## 2017-03-09 DIAGNOSIS — M25512 Pain in left shoulder: Secondary | ICD-10-CM | POA: Diagnosis not present

## 2017-04-29 ENCOUNTER — Other Ambulatory Visit: Payer: Self-pay | Admitting: Obstetrics and Gynecology

## 2017-04-29 ENCOUNTER — Ambulatory Visit
Admission: RE | Admit: 2017-04-29 | Discharge: 2017-04-29 | Disposition: A | Discharge status: Home / Self Care | Payer: 59 | Source: Ambulatory Visit | Attending: Obstetrics and Gynecology | Admitting: Obstetrics and Gynecology

## 2017-04-29 DIAGNOSIS — R921 Mammographic calcification found on diagnostic imaging of breast: Secondary | ICD-10-CM

## 2017-05-29 MED FILL — PANTOPRAZOLE SOD DR 40 MG T: 40 | 90 days supply | Qty: 90 | Fill #0

## 2017-06-28 IMAGING — MG DIGITAL DIAGNOSTIC UNILATERAL LEFT MAMMOGRAM
3 series · 3 of 3 positions shown · non-contrast
Comparison: Previous exam(s).

CLINICAL DATA: Left breast calcifications seen on most recent
screening mammography.

EXAM:
DIGITAL DIAGNOSTIC LEFT MAMMOGRAM WITH CAD

[L CC]
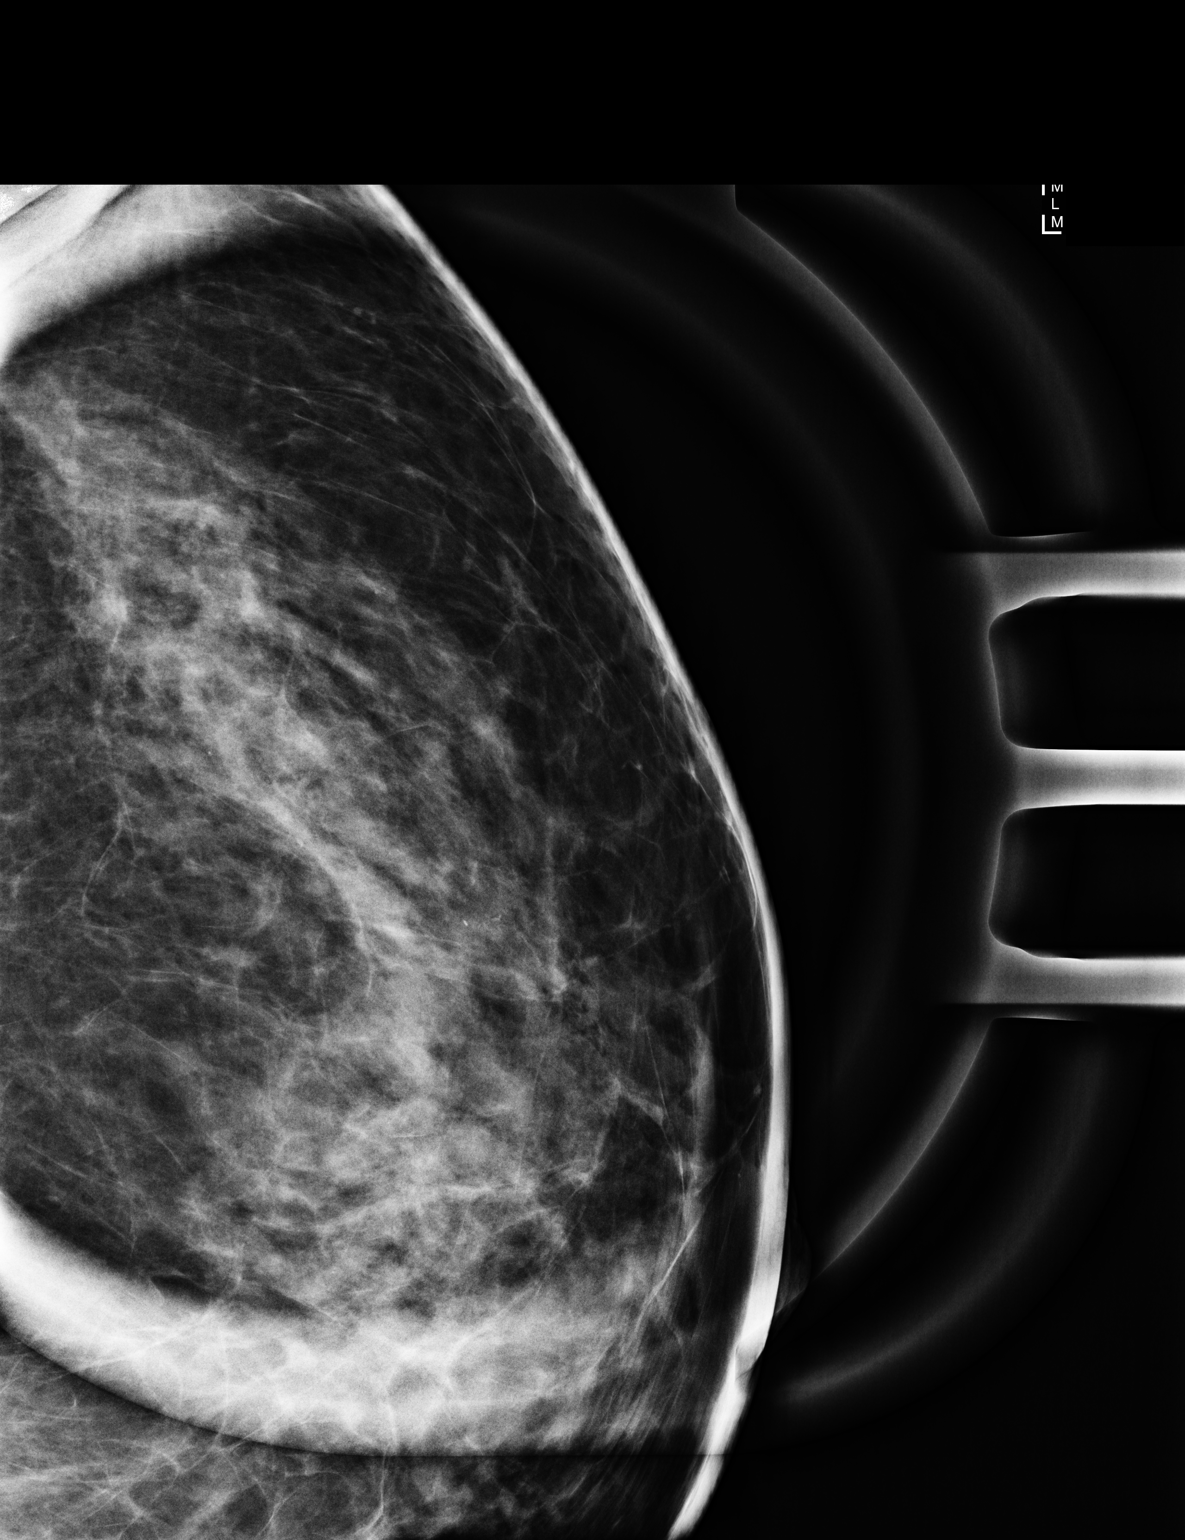

[L ML (1 of 2)]
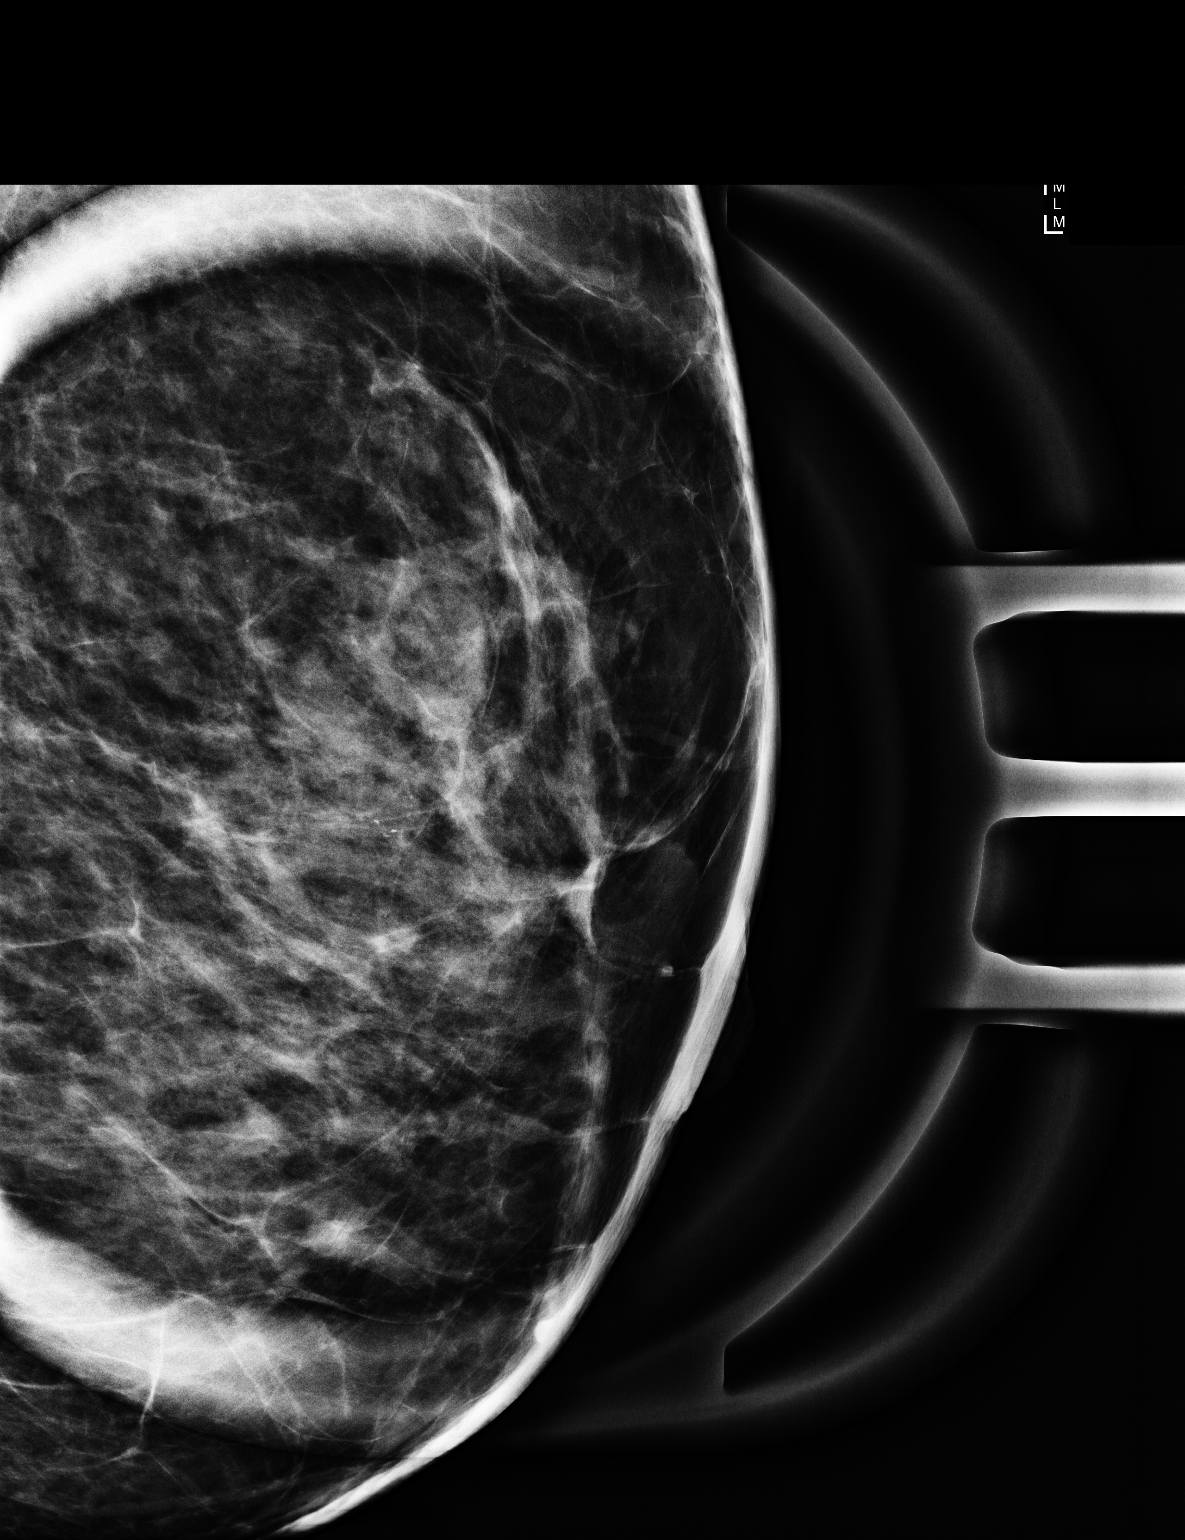

[L ML (2 of 2)]
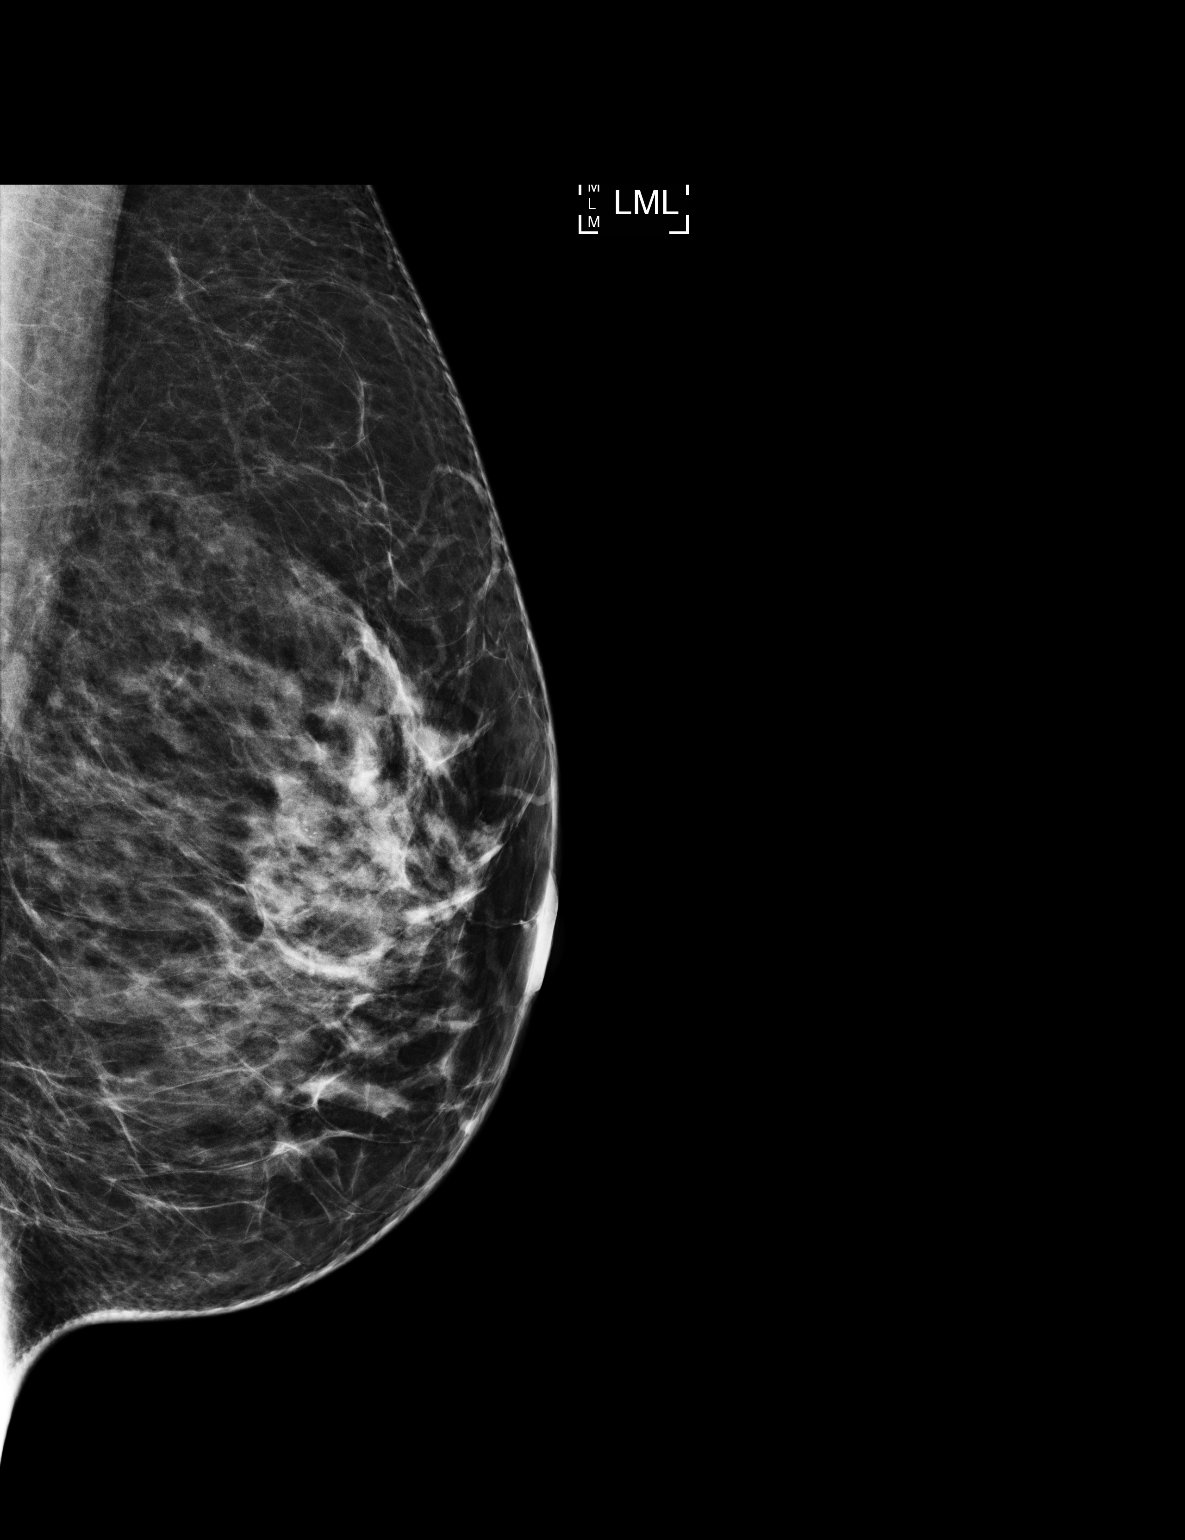

[3 of 3 positions shown; findings below may reference images not displayed]

ACR Breast Density Category c: The breast tissue is heterogeneously
dense, which may obscure small masses.
FINDINGS: Compression magnification views of the left breast demonstrate 5 mm
group of benign-appearing calcifications in the left breast upper
outer quadrant, anterior depth. No associated mass.

Mammographic images were processed with CAD.
IMPRESSION: Probably benign group of calcifications in the left breast upper
outer quadrant, for which six-month follow-up is recommended.

RECOMMENDATION:
Diagnostic mammogram of the left breast in 6 months. (Code:[id number])

I have discussed the findings and recommendations with the patient.
Results were also provided in writing at the conclusion of the
visit. If applicable, a reminder letter will be sent to the patient
regarding the next appointment.

BI-RADS CATEGORY  3: Probably benign finding(s) - short interval
follow-up suggested.

## 2017-07-27 ENCOUNTER — Ambulatory Visit: Payer: Self-pay

## 2017-07-27 ENCOUNTER — Other Ambulatory Visit: Payer: Self-pay | Admitting: Occupational Medicine

## 2017-07-27 DIAGNOSIS — M25562 Pain in left knee: Secondary | ICD-10-CM

## 2017-08-05 DIAGNOSIS — Z13220 Encounter for screening for lipoid disorders: Secondary | ICD-10-CM | POA: Diagnosis not present

## 2017-08-05 DIAGNOSIS — Z136 Encounter for screening for cardiovascular disorders: Secondary | ICD-10-CM | POA: Diagnosis not present

## 2017-08-05 DIAGNOSIS — K219 Gastro-esophageal reflux disease without esophagitis: Secondary | ICD-10-CM | POA: Diagnosis not present

## 2017-08-05 DIAGNOSIS — Z0000 Encounter for general adult medical examination without abnormal findings: Secondary | ICD-10-CM | POA: Diagnosis not present

## 2017-09-09 MED FILL — PANTOPRAZOLE SOD DR 40 MG T: 40 | 90 days supply | Qty: 90 | Fill #0

## 2017-11-09 ENCOUNTER — Ambulatory Visit
Admission: RE | Admit: 2017-11-09 | Discharge: 2017-11-09 | Disposition: A | Discharge status: Home / Self Care | Payer: 59 | Source: Ambulatory Visit | Attending: Obstetrics and Gynecology | Admitting: Obstetrics and Gynecology

## 2017-11-09 DIAGNOSIS — R921 Mammographic calcification found on diagnostic imaging of breast: Secondary | ICD-10-CM | POA: Diagnosis not present

## 2017-11-09 DIAGNOSIS — Z6826 Body mass index (BMI) 26.0-26.9, adult: Secondary | ICD-10-CM | POA: Diagnosis not present

## 2017-11-09 DIAGNOSIS — Z01419 Encounter for gynecological examination (general) (routine) without abnormal findings: Secondary | ICD-10-CM | POA: Diagnosis not present

## 2017-12-01 MED FILL — PANTOPRAZOLE SOD DR 40 MG T: 40 | 90 days supply | Qty: 90 | Fill #1

## 2018-01-04 MED FILL — ACYCLOVIR 5% OINTMENT: 5 | 7 days supply | Qty: 15 | Fill #0

## 2018-03-01 DIAGNOSIS — H31001 Unspecified chorioretinal scars, right eye: Secondary | ICD-10-CM | POA: Diagnosis not present

## 2018-03-01 DIAGNOSIS — H52223 Regular astigmatism, bilateral: Secondary | ICD-10-CM | POA: Diagnosis not present

## 2018-03-01 DIAGNOSIS — H5213 Myopia, bilateral: Secondary | ICD-10-CM | POA: Diagnosis not present

## 2018-03-01 DIAGNOSIS — H524 Presbyopia: Secondary | ICD-10-CM | POA: Diagnosis not present

## 2018-03-01 DIAGNOSIS — H0014 Chalazion left upper eyelid: Secondary | ICD-10-CM | POA: Diagnosis not present

## 2018-03-11 DIAGNOSIS — H0014 Chalazion left upper eyelid: Secondary | ICD-10-CM | POA: Diagnosis not present

## 2018-03-11 MED FILL — TOBRAMYCIN 0.3 % SOLN: 0.3 | 25 days supply | Qty: 5 | Fill #0

## 2018-08-24 DIAGNOSIS — Z13220 Encounter for screening for lipoid disorders: Secondary | ICD-10-CM | POA: Diagnosis not present

## 2018-08-24 DIAGNOSIS — Z0000 Encounter for general adult medical examination without abnormal findings: Secondary | ICD-10-CM | POA: Diagnosis not present

## 2018-08-24 DIAGNOSIS — K219 Gastro-esophageal reflux disease without esophagitis: Secondary | ICD-10-CM | POA: Diagnosis not present

## 2018-08-24 MED FILL — PANTOPRAZOLE SOD DR 40 MG T: 40 | 90 days supply | Qty: 90 | Fill #0

## 2018-11-03 MED FILL — ACYCLOVIR 5% OINTMENT: 5 | 14 days supply | Qty: 30 | Fill #1

## 2018-11-29 DIAGNOSIS — Z01419 Encounter for gynecological examination (general) (routine) without abnormal findings: Secondary | ICD-10-CM | POA: Diagnosis not present

## 2018-11-29 DIAGNOSIS — Z1231 Encounter for screening mammogram for malignant neoplasm of breast: Secondary | ICD-10-CM | POA: Diagnosis not present

## 2018-11-29 DIAGNOSIS — Z6826 Body mass index (BMI) 26.0-26.9, adult: Secondary | ICD-10-CM | POA: Diagnosis not present

## 2018-12-08 ENCOUNTER — Other Ambulatory Visit: Payer: Self-pay | Admitting: Family Medicine

## 2018-12-08 ENCOUNTER — Ambulatory Visit: Payer: Self-pay

## 2018-12-08 ENCOUNTER — Other Ambulatory Visit: Payer: Self-pay

## 2018-12-08 DIAGNOSIS — M25521 Pain in right elbow: Secondary | ICD-10-CM

## 2018-12-08 DIAGNOSIS — M25531 Pain in right wrist: Secondary | ICD-10-CM

## 2018-12-16 MED FILL — PANTOPRAZOLE SOD DR 40 MG T: 40 | 90 days supply | Qty: 90 | Fill #1

## 2018-12-20 MED FILL — ACYCLOVIR 5% OINTMENT: 5 | 7 days supply | Qty: 15 | Fill #0

## 2019-03-07 DIAGNOSIS — H52221 Regular astigmatism, right eye: Secondary | ICD-10-CM | POA: Diagnosis not present

## 2019-03-07 DIAGNOSIS — H524 Presbyopia: Secondary | ICD-10-CM | POA: Diagnosis not present

## 2019-03-07 DIAGNOSIS — H31001 Unspecified chorioretinal scars, right eye: Secondary | ICD-10-CM | POA: Diagnosis not present

## 2019-03-07 DIAGNOSIS — H5213 Myopia, bilateral: Secondary | ICD-10-CM | POA: Diagnosis not present

## 2019-03-07 DIAGNOSIS — H40003 Preglaucoma, unspecified, bilateral: Secondary | ICD-10-CM | POA: Diagnosis not present

## 2019-03-14 MED FILL — PANTOPRAZOLE SOD DR 40 MG T: 40 | 90 days supply | Qty: 90 | Fill #2

## 2019-03-21 DIAGNOSIS — M65342 Trigger finger, left ring finger: Secondary | ICD-10-CM | POA: Diagnosis not present

## 2019-03-21 DIAGNOSIS — M79645 Pain in left finger(s): Secondary | ICD-10-CM | POA: Diagnosis not present

## 2019-04-18 DIAGNOSIS — M65342 Trigger finger, left ring finger: Secondary | ICD-10-CM | POA: Diagnosis not present

## 2019-07-12 MED FILL — PANTOPRAZOLE SOD DR 40 MG T: 40 | 90 days supply | Qty: 90 | Fill #3

## 2019-09-12 MED FILL — CEPHALEXIN 500 MG CAPSULE: 500 | 8 days supply | Qty: 32 | Fill #0

## 2019-10-19 ENCOUNTER — Other Ambulatory Visit (HOSPITAL_COMMUNITY): Payer: Self-pay | Admitting: Physician Assistant

## 2019-10-19 DIAGNOSIS — K219 Gastro-esophageal reflux disease without esophagitis: Secondary | ICD-10-CM | POA: Diagnosis not present

## 2019-10-19 DIAGNOSIS — Z13220 Encounter for screening for lipoid disorders: Secondary | ICD-10-CM | POA: Diagnosis not present

## 2019-10-19 DIAGNOSIS — Z0000 Encounter for general adult medical examination without abnormal findings: Secondary | ICD-10-CM | POA: Diagnosis not present

## 2019-10-19 MED FILL — PANTOPRAZOLE SOD DR 40 MG T: 40 | 90 days supply | Qty: 90 | Fill #0

## 2019-10-27 DIAGNOSIS — M65342 Trigger finger, left ring finger: Secondary | ICD-10-CM | POA: Diagnosis not present

## 2019-10-27 DIAGNOSIS — Z4789 Encounter for other orthopedic aftercare: Secondary | ICD-10-CM | POA: Diagnosis not present

## 2019-10-27 MED FILL — CEPHALEXIN 500 MG CAPSULE: 500 | 5 days supply | Qty: 20 | Fill #0

## 2019-11-04 ENCOUNTER — Other Ambulatory Visit (HOSPITAL_COMMUNITY): Payer: Self-pay | Admitting: Physician Assistant

## 2019-11-04 MED FILL — VIT D3-50 50,000 UNITS CAPS: 1.25 MG | 28 days supply | Qty: 4 | Fill #0

## 2019-11-11 DIAGNOSIS — M25642 Stiffness of left hand, not elsewhere classified: Secondary | ICD-10-CM | POA: Diagnosis not present

## 2019-11-28 ENCOUNTER — Other Ambulatory Visit (HOSPITAL_COMMUNITY): Payer: Self-pay | Admitting: Internal Medicine

## 2019-11-28 MED FILL — FLUARIX QUADRIVALENT 0.5 ML: 0.5 | 1 days supply | Qty: 1 | Fill #0

## 2019-12-08 MED FILL — VIT D3-50 50,000 UNITS CAPS: 1.25 MG | 84 days supply | Qty: 12 | Fill #1

## 2020-02-06 MED FILL — PANTOPRAZOLE SOD DR 40 MG T: 40 | 90 days supply | Qty: 90 | Fill #1

## 2020-03-05 ENCOUNTER — Other Ambulatory Visit (HOSPITAL_COMMUNITY): Payer: Self-pay | Admitting: Obstetrics and Gynecology

## 2020-03-05 DIAGNOSIS — Z6827 Body mass index (BMI) 27.0-27.9, adult: Secondary | ICD-10-CM | POA: Diagnosis not present

## 2020-03-05 DIAGNOSIS — Z1231 Encounter for screening mammogram for malignant neoplasm of breast: Secondary | ICD-10-CM | POA: Diagnosis not present

## 2020-03-05 DIAGNOSIS — Z01419 Encounter for gynecological examination (general) (routine) without abnormal findings: Secondary | ICD-10-CM | POA: Diagnosis not present

## 2020-03-05 MED FILL — ESTRADIOL 0.1 MG/GM CREA: 0.1 | 90 days supply | Qty: 43 | Fill #0

## 2020-03-06 ENCOUNTER — Other Ambulatory Visit (HOSPITAL_COMMUNITY): Payer: Self-pay

## 2020-03-06 MED FILL — VIT D3-50 50,000 UNITS CAPS: 1.25 MG | 84 days supply | Qty: 12 | Fill #2

## 2020-03-06 MED FILL — SHINGRIX 50 MCG SUS: 50 | 1 days supply | Qty: 1 | Fill #0

## 2020-03-07 MED FILL — ACYCLOVIR 5% OINTMENT: 5 | 7 days supply | Qty: 15 | Fill #0

## 2020-03-26 DIAGNOSIS — H40013 Open angle with borderline findings, low risk, bilateral: Secondary | ICD-10-CM | POA: Diagnosis not present

## 2020-04-30 MED FILL — PANTOPRAZOLE SOD DR 40 MG T: 40 | 90 days supply | Qty: 90 | Fill #2

## 2021-05-06 ENCOUNTER — Encounter: Payer: Self-pay | Admitting: Gastroenterology

## 2021-08-05 ENCOUNTER — Other Ambulatory Visit (HOSPITAL_COMMUNITY): Payer: Self-pay

## 2021-08-05 MED ORDER — LISINOPRIL 5 MG PO TABS
5.0000 mg | ORAL_TABLET | Freq: Every day | ORAL | 1 refills | Status: DC
  Filled 2021-08-05: qty 90, 90d supply, fill #0

## 2021-08-05 MED ORDER — PANTOPRAZOLE SODIUM 40 MG PO TBEC
40.0000 mg | DELAYED_RELEASE_TABLET | Freq: Every day | ORAL | 1 refills | Status: DC
  Filled 2021-08-05: qty 90, 90d supply, fill #0
  Filled 2021-11-02: qty 90, 90d supply, fill #1

## 2021-08-05 MED ORDER — AZELASTINE HCL 0.1 % NA SOLN
1.0000 | Freq: Two times a day (BID) | NASAL | 1 refills | Status: AC
  Filled 2021-08-05: qty 30, 50d supply, fill #0
  Filled 2022-03-23: qty 30, 50d supply, fill #1

## 2021-10-14 ENCOUNTER — Other Ambulatory Visit (HOSPITAL_COMMUNITY): Payer: Self-pay

## 2021-10-14 MED ORDER — LOSARTAN POTASSIUM 25 MG PO TABS
25.0000 mg | ORAL_TABLET | Freq: Every day | ORAL | 0 refills | Status: DC
  Filled 2021-10-14: qty 90, 90d supply, fill #0

## 2021-10-16 ENCOUNTER — Other Ambulatory Visit (HOSPITAL_COMMUNITY): Payer: Self-pay

## 2021-11-04 ENCOUNTER — Other Ambulatory Visit (HOSPITAL_COMMUNITY): Payer: Self-pay

## 2021-12-03 ENCOUNTER — Other Ambulatory Visit (HOSPITAL_COMMUNITY): Payer: Self-pay

## 2021-12-03 DIAGNOSIS — L920 Granuloma annulare: Secondary | ICD-10-CM | POA: Diagnosis not present

## 2021-12-03 DIAGNOSIS — I10 Essential (primary) hypertension: Secondary | ICD-10-CM | POA: Diagnosis not present

## 2021-12-03 DIAGNOSIS — Z0000 Encounter for general adult medical examination without abnormal findings: Secondary | ICD-10-CM | POA: Diagnosis not present

## 2021-12-03 MED ORDER — PANTOPRAZOLE SODIUM 40 MG PO TBEC
40.0000 mg | DELAYED_RELEASE_TABLET | Freq: Every day | ORAL | 3 refills | Status: DC
  Filled 2021-12-03 – 2022-01-26 (×2): qty 90, 90d supply, fill #0
  Filled 2022-04-29: qty 90, 90d supply, fill #1
  Filled 2022-07-27: qty 90, 90d supply, fill #2
  Filled 2022-11-17: qty 90, 90d supply, fill #3

## 2021-12-03 MED ORDER — TRIAMCINOLONE ACETONIDE 0.1 % EX CREA
TOPICAL_CREAM | Freq: Two times a day (BID) | CUTANEOUS | 0 refills | Status: AC | PRN
  Filled 2021-12-03: qty 80, 30d supply, fill #0

## 2021-12-03 MED ORDER — LOSARTAN POTASSIUM 25 MG PO TABS
25.0000 mg | ORAL_TABLET | Freq: Every day | ORAL | 3 refills | Status: DC
  Filled 2021-12-03 – 2021-12-29 (×2): qty 90, 90d supply, fill #0
  Filled 2022-03-18 – 2022-03-21 (×5): qty 90, 90d supply, fill #1
  Filled 2022-07-02: qty 90, 90d supply, fill #2
  Filled 2022-10-05: qty 90, 90d supply, fill #3

## 2021-12-30 ENCOUNTER — Other Ambulatory Visit (HOSPITAL_COMMUNITY): Payer: Self-pay

## 2022-01-27 ENCOUNTER — Other Ambulatory Visit (HOSPITAL_COMMUNITY): Payer: Self-pay

## 2022-03-12 DIAGNOSIS — H40013 Open angle with borderline findings, low risk, bilateral: Secondary | ICD-10-CM | POA: Diagnosis not present

## 2022-03-18 ENCOUNTER — Other Ambulatory Visit (HOSPITAL_COMMUNITY): Payer: Self-pay

## 2022-03-19 ENCOUNTER — Other Ambulatory Visit (HOSPITAL_COMMUNITY): Payer: Self-pay

## 2022-03-21 ENCOUNTER — Other Ambulatory Visit (HOSPITAL_COMMUNITY): Payer: Self-pay

## 2022-06-17 ENCOUNTER — Other Ambulatory Visit: Payer: Self-pay | Admitting: Internal Medicine

## 2022-06-17 DIAGNOSIS — Z1231 Encounter for screening mammogram for malignant neoplasm of breast: Secondary | ICD-10-CM

## 2022-06-18 ENCOUNTER — Other Ambulatory Visit: Payer: Self-pay | Admitting: Internal Medicine

## 2022-06-18 DIAGNOSIS — Z1231 Encounter for screening mammogram for malignant neoplasm of breast: Secondary | ICD-10-CM

## 2022-07-02 ENCOUNTER — Other Ambulatory Visit (HOSPITAL_COMMUNITY): Payer: Self-pay

## 2022-07-16 ENCOUNTER — Ambulatory Visit
Admission: RE | Admit: 2022-07-16 | Discharge: 2022-07-16 | Disposition: A | Discharge status: Home / Self Care | Payer: Commercial Managed Care - PPO | Source: Ambulatory Visit

## 2022-07-16 DIAGNOSIS — Z1231 Encounter for screening mammogram for malignant neoplasm of breast: Secondary | ICD-10-CM | POA: Diagnosis not present

## 2022-07-28 ENCOUNTER — Other Ambulatory Visit (HOSPITAL_COMMUNITY): Payer: Self-pay

## 2022-09-12 DIAGNOSIS — I10 Essential (primary) hypertension: Secondary | ICD-10-CM | POA: Diagnosis not present

## 2022-09-12 DIAGNOSIS — Z79899 Other long term (current) drug therapy: Secondary | ICD-10-CM | POA: Diagnosis not present

## 2022-09-12 DIAGNOSIS — R040 Epistaxis: Secondary | ICD-10-CM | POA: Diagnosis not present

## 2022-09-17 ENCOUNTER — Other Ambulatory Visit (HOSPITAL_COMMUNITY): Payer: Self-pay

## 2022-09-17 DIAGNOSIS — R040 Epistaxis: Secondary | ICD-10-CM | POA: Diagnosis not present

## 2022-09-17 MED ORDER — MUPIROCIN 2 % EX OINT
TOPICAL_OINTMENT | Freq: Two times a day (BID) | CUTANEOUS | 1 refills | Status: AC
  Filled 2022-09-17: qty 22, 14d supply, fill #0

## 2022-12-18 ENCOUNTER — Other Ambulatory Visit (HOSPITAL_COMMUNITY): Payer: Self-pay

## 2022-12-18 DIAGNOSIS — N952 Postmenopausal atrophic vaginitis: Secondary | ICD-10-CM | POA: Diagnosis not present

## 2022-12-18 DIAGNOSIS — I10 Essential (primary) hypertension: Secondary | ICD-10-CM | POA: Diagnosis not present

## 2022-12-18 DIAGNOSIS — Z13220 Encounter for screening for lipoid disorders: Secondary | ICD-10-CM | POA: Diagnosis not present

## 2022-12-18 DIAGNOSIS — Z0000 Encounter for general adult medical examination without abnormal findings: Secondary | ICD-10-CM | POA: Diagnosis not present

## 2022-12-18 MED ORDER — PANTOPRAZOLE SODIUM 40 MG PO TBEC
40.0000 mg | DELAYED_RELEASE_TABLET | Freq: Every day | ORAL | 3 refills | Status: DC
  Filled 2022-12-18 – 2023-02-10 (×2): qty 90, 90d supply, fill #0
  Filled 2023-05-12: qty 90, 90d supply, fill #1
  Filled 2023-08-09: qty 90, 90d supply, fill #2
  Filled 2023-11-15: qty 90, 90d supply, fill #3

## 2022-12-18 MED ORDER — LOSARTAN POTASSIUM 25 MG PO TABS
25.0000 mg | ORAL_TABLET | Freq: Every day | ORAL | 3 refills | Status: DC
  Filled 2022-12-18: qty 90, 90d supply, fill #0
  Filled 2023-04-01: qty 90, 90d supply, fill #1
  Filled 2023-06-27: qty 90, 90d supply, fill #2
  Filled 2023-09-29: qty 90, 90d supply, fill #3

## 2022-12-19 ENCOUNTER — Other Ambulatory Visit: Payer: Self-pay

## 2023-02-10 ENCOUNTER — Other Ambulatory Visit (HOSPITAL_COMMUNITY): Payer: Self-pay

## 2023-02-12 ENCOUNTER — Other Ambulatory Visit (HOSPITAL_COMMUNITY): Payer: Self-pay

## 2023-02-12 ENCOUNTER — Ambulatory Visit (AMBULATORY_SURGERY_CENTER): Payer: Commercial Managed Care - PPO

## 2023-02-12 VITALS — Ht 60.0 in | Wt 136.0 lb

## 2023-02-12 DIAGNOSIS — Z860100 Personal history of colon polyps, unspecified: Secondary | ICD-10-CM

## 2023-02-12 MED ORDER — SUTAB 1479-225-188 MG PO TABS
12.0000 | ORAL_TABLET | ORAL | 0 refills | Status: AC
  Filled 2023-02-12: qty 24, 2d supply, fill #0

## 2023-02-12 NOTE — Progress Notes (Signed)
No egg or soy allergy known to patient  No issues known to pt with past sedation with any surgeries or procedures Patient denies ever being told they had issues or difficulty with intubation  No FH of Malignant Hyperthermia in immediate familty  Pt is not on diet pills Pt is not on  home 02  Pt is not on blood thinners  Pt denies issues with constipation  No A fib or A flutter Have any cardiac testing pending-- no  LOA: independent  Prep: sutab  Patient's chart reviewed by Cathlyn Parsons CNRA prior to previsit and patient appropriate for the [facility].  Previsit completed and red dot placed by patient's name on their procedure day (on provider's schedule).     PV competed with patient. Prep instructions sent via mychart.

## 2023-03-04 ENCOUNTER — Encounter: Payer: Self-pay | Admitting: Gastroenterology

## 2023-03-08 ENCOUNTER — Encounter: Payer: Self-pay | Admitting: Certified Registered Nurse Anesthetist

## 2023-03-09 ENCOUNTER — Ambulatory Visit (AMBULATORY_SURGERY_CENTER): Payer: Commercial Managed Care - PPO | Admitting: Gastroenterology

## 2023-03-09 ENCOUNTER — Encounter: Payer: Self-pay | Admitting: Gastroenterology

## 2023-03-09 VITALS — BP 151/66 | HR 52 | Temp 98.4°F | Resp 11 | Ht 60.0 in | Wt 135.0 lb

## 2023-03-09 DIAGNOSIS — Z860101 Personal history of adenomatous and serrated colon polyps: Secondary | ICD-10-CM | POA: Diagnosis not present

## 2023-03-09 DIAGNOSIS — Z1211 Encounter for screening for malignant neoplasm of colon: Secondary | ICD-10-CM | POA: Diagnosis not present

## 2023-03-09 DIAGNOSIS — D123 Benign neoplasm of transverse colon: Secondary | ICD-10-CM

## 2023-03-09 DIAGNOSIS — K5730 Diverticulosis of large intestine without perforation or abscess without bleeding: Secondary | ICD-10-CM | POA: Diagnosis not present

## 2023-03-09 DIAGNOSIS — D120 Benign neoplasm of cecum: Secondary | ICD-10-CM

## 2023-03-09 DIAGNOSIS — Z860100 Personal history of colon polyps, unspecified: Secondary | ICD-10-CM | POA: Diagnosis not present

## 2023-03-09 MED ORDER — SODIUM CHLORIDE 0.9 % IV SOLN: 500.0000 mL | Freq: Once | INTRAVENOUS | Status: DC

## 2023-03-09 NOTE — Op Note (Signed)
 [facility] Patient Name: Michaela Gonzalez Procedure Date: 03/09/2023 9:58 AM MRN: [id number] Endoscopist: Victory L. Legrand , MD, [id number] Age: 58 Referring MD:  Date of Birth: Jul 02, 1965 Gender: Female Account #: [id number] Procedure:                Colonoscopy Indications:              Surveillance: Personal history of adenomatous                            polyps on last colonoscopy > 5 years ago                           diminutive TA on 1st colonoscopy October 2017 Medicines:                Monitored Anesthesia Care Procedure:                Pre-Anesthesia Assessment:                           - Prior to the procedure, a History and Physical                            was performed, and patient medications and                            allergies were reviewed. The patient's tolerance of                            previous anesthesia was also reviewed. The risks                            and benefits of the procedure and the sedation                            options and risks were discussed with the patient.                            All questions were answered, and informed consent                            was obtained. Prior Anticoagulants: The patient has                            taken no anticoagulant or antiplatelet agents. ASA                            Grade Assessment: II - A patient with mild systemic                            disease. After reviewing the risks and benefits,                            the patient was deemed in satisfactory condition to  undergo the procedure.                           After obtaining informed consent, the colonoscope                            was passed under direct vision. Throughout the                            procedure, the patient's blood pressure, pulse, and                            oxygen saturations were monitored continuously. The                            CF HQ190L #[id number] was  introduced through the anus                            and advanced to the the cecum, identified by                            appendiceal orifice and ileocecal valve. The                            colonoscopy was performed without difficulty. The                            patient tolerated the procedure well. The quality                            of the bowel preparation was excellent. The                            ileocecal valve, appendiceal orifice, and rectum                            were photographed. The bowel preparation used was                            SUPREP. Scope In: 10:22:34 AM Scope Out: 10:36:57 AM Scope Withdrawal Time: 0 hours 10 minutes 14 seconds  Total Procedure Duration: 0 hours 14 minutes 23 seconds  Findings:                 The perianal and digital rectal examinations were                            normal.                           Repeat examination of right colon under NBI                            performed.  Two sessile polyps were found in the transverse                            colon and cecum. The polyps were diminutive in                            size. These polyps were removed with a cold snare.                            Resection and retrieval were complete.                           Multiple diverticula were found in the left colon.                           The exam was otherwise without abnormality on                            direct and retroflexion views. Complications:            No immediate complications. Estimated Blood Loss:     Estimated blood loss was minimal. Impression:               - Two diminutive polyps in the transverse colon and                            in the cecum, removed with a cold snare. Resected                            and retrieved.                           - Diverticulosis in the left colon.                           - The examination was otherwise normal on direct                             and retroflexion views. Recommendation:           - Patient has a contact number available for                            emergencies. The signs and symptoms of potential                            delayed complications were discussed with the                            patient. Return to normal activities tomorrow.                            Written discharge instructions were provided to the  patient.                           - Resume previous diet.                           - Continue present medications.                           - Await pathology results.                           - Repeat colonoscopy is recommended for                            surveillance. The colonoscopy date will be                            determined after pathology results from today's                            exam become available for review. Vidyuth Belsito L. Legrand, MD 03/09/2023 10:40:48 AM This report has been signed electronically.

## 2023-03-09 NOTE — Patient Instructions (Signed)
 Please read handouts provided. Continue present medications. Resume previous diet. Await pathology results.   YOU HAD AN ENDOSCOPIC PROCEDURE TODAY AT THE [facility]:   Refer to the procedure report that was given to you for any specific questions about what was found during the examination.  If the procedure report does not answer your questions, please call your gastroenterologist to clarify.  If you requested that your care partner not be given the details of your procedure findings, then the procedure report has been included in a sealed envelope for you to review at your convenience later.  YOU SHOULD EXPECT: Some feelings of bloating in the abdomen. Passage of more gas than usual.  Walking can help get rid of the air that was put into your GI tract during the procedure and reduce the bloating. If you had a lower endoscopy (such as a colonoscopy or flexible sigmoidoscopy) you may notice spotting of blood in your stool or on the toilet paper. If you underwent a bowel prep for your procedure, you may not have a normal bowel movement for a few days.  Please Note:  You might notice some irritation and congestion in your nose or some drainage.  This is from the oxygen used during your procedure.  There is no need for concern and it should clear up in a day or so.  SYMPTOMS TO REPORT IMMEDIATELY:  Following lower endoscopy (colonoscopy or flexible sigmoidoscopy):  Excessive amounts of blood in the stool  Significant tenderness or worsening of abdominal pains  Swelling of the abdomen that is new, acute  Fever of 100F or higher.  For urgent or emergent issues, a gastroenterologist can be reached at any hour by calling [phone]. Do not use MyChart messaging for urgent concerns.    DIET:  We do recommend a small meal at first, but then you may proceed to your regular diet.  Drink plenty of fluids but you should avoid alcoholic beverages for 24 hours.  ACTIVITY:  You should  plan to take it easy for the rest of today and you should NOT DRIVE or use heavy machinery until tomorrow (because of the sedation medicines used during the test).    FOLLOW UP: Our staff will call the number listed on your records the next business day following your procedure.  We will call around 7:15- 8:00 am to check on you and address any questions or concerns that you may have regarding the information given to you following your procedure. If we do not reach you, we will leave a message.     If any biopsies were taken you will be contacted by phone or by letter within the next 1-3 weeks.  Please call us at [phone] if you have not heard about the biopsies in 3 weeks.    SIGNATURES/CONFIDENTIALITY: You and/or your care partner have signed paperwork which will be entered into your electronic medical record.  These signatures attest to the fact that that the information above on your After Visit Summary has been reviewed and is understood.  Full responsibility of the confidentiality of this discharge information lies with you and/or your care-partner.

## 2023-03-09 NOTE — Progress Notes (Signed)
 History and Physical:  This patient presents for endoscopic testing for: Encounter Diagnosis  Name Primary?   Hx of colonic polyps Yes    Surveillance colonoscopy today for Hx diminutive TA in Oct 2017 Patient denies chronic abdominal pain, rectal bleeding, constipation or diarrhea.   Patient is otherwise without complaints or active issues today.   Past Medical History: Past Medical History:  Diagnosis Date   GERD (gastroesophageal reflux disease)    Heart murmur      Past Surgical History: Past Surgical History:  Procedure Laterality Date   BUNIONECTOMY     VAGINAL DELIVERY     x2    Allergies: Allergies  Allergen Reactions   Lisinopril      Cough  Other Reaction(s): Cough, Cough (ALLERGY/intolerance)    Outpatient Meds: Current Outpatient Medications  Medication Sig Dispense Refill   losartan  (COZAAR ) 25 MG tablet Take 1 tablet (25 mg total) by mouth daily. 90 tablet 3   pantoprazole  (PROTONIX ) 40 MG tablet Take 1 tablet (40 mg total) by mouth daily. 90 tablet 3   Sodium Sulfate-Mag Sulfate-KCl (SUTAB ) 1479-225-188 MG TABS Take 12 tablets by mouth as directed. 24 tablet 0   acyclovir ointment (ZOVIRAX) 5 % Apply 1 Application topically as needed.     azelastine  (ASTELIN ) 0.1 % nasal spray Place 1 spray into both nostrils 2 (two) times daily as directed 30 mL 1   estradiol (ESTRACE) 0.1 MG/GM vaginal cream Place 1 Applicatorful vaginally as needed.     famotidine  (PEPCID ) 20 MG tablet Take 1 tablet (20 mg total) by mouth 2 (two) times daily. (Patient not taking: Reported on 12/03/2015) 30 tablet 0   mupirocin  ointment (BACTROBAN ) 2 % Apply a small amount to affected area 2 (two) times daily for 14 days 22 g 1   triamcinolone  cream (KENALOG ) 0.1 % Apply topically to rash 2 (two) times daily as needed. 80 g 0   Current Facility-Administered Medications  Medication Dose Route Frequency Provider Last Rate Last Admin   0.9 %  sodium chloride  infusion  500 mL  Intravenous Continuous Danis, Victory CROME III, MD       0.9 %  sodium chloride  infusion  500 mL Intravenous Once Danis, Victory CROME MOULD, MD          ___________________________________________________________________ Objective   Exam:  BP (!) 148/77   Pulse (!) 55   Temp 98.4 F (36.9 C) (Temporal)   Ht 5' (1.524 m)   Wt 135 lb (61.2 kg)   SpO2 99%   BMI 26.37 kg/m   CV: regular , S1/S2 Resp: clear to auscultation bilaterally, normal RR and effort noted GI: soft, no tenderness, with active bowel sounds.   Assessment: Encounter Diagnosis  Name Primary?   Hx of colonic polyps Yes     Plan: Colonoscopy   The benefits and risks of the planned procedure were described in detail with the patient or (when appropriate) their health care proxy.  Risks were outlined as including, but not limited to, bleeding, infection, perforation, adverse medication reaction leading to cardiac or pulmonary decompensation, pancreatitis (if ERCP).  The limitation of incomplete mucosal visualization was also discussed.  No guarantees or warranties were given.  The patient is appropriate for an endoscopic procedure in the ambulatory setting.   - Victory Brand, MD

## 2023-03-09 NOTE — Progress Notes (Signed)
 Report given to PACU, vss

## 2023-03-09 NOTE — Progress Notes (Signed)
 Pt's states no medical or surgical changes since previsit or office visit.

## 2023-03-09 NOTE — Progress Notes (Signed)
 Called to room to assist during endoscopic procedure.  Patient ID and intended procedure confirmed with present staff. Received instructions for my participation in the procedure from the performing physician.

## 2023-03-10 ENCOUNTER — Telehealth: Payer: Self-pay

## 2023-03-10 NOTE — Telephone Encounter (Signed)
Attempted f/u call, no answer, left VM. ?

## 2023-03-11 ENCOUNTER — Encounter: Payer: Self-pay | Admitting: Gastroenterology

## 2023-03-11 LAB — SURGICAL PATHOLOGY

## 2023-04-02 ENCOUNTER — Other Ambulatory Visit (HOSPITAL_COMMUNITY): Payer: Self-pay

## 2023-05-06 DIAGNOSIS — H40013 Open angle with borderline findings, low risk, bilateral: Secondary | ICD-10-CM | POA: Diagnosis not present

## 2023-06-19 ENCOUNTER — Other Ambulatory Visit: Payer: Self-pay | Admitting: Internal Medicine

## 2023-06-19 DIAGNOSIS — Z1231 Encounter for screening mammogram for malignant neoplasm of breast: Secondary | ICD-10-CM

## 2023-07-01 ENCOUNTER — Encounter

## 2023-07-01 DIAGNOSIS — Z1231 Encounter for screening mammogram for malignant neoplasm of breast: Secondary | ICD-10-CM

## 2023-07-22 ENCOUNTER — Ambulatory Visit
Admission: RE | Admit: 2023-07-22 | Discharge: 2023-07-22 | Disposition: A | Discharge status: Home / Self Care | Source: Ambulatory Visit

## 2023-07-22 DIAGNOSIS — Z1231 Encounter for screening mammogram for malignant neoplasm of breast: Secondary | ICD-10-CM

## 2023-12-18 DIAGNOSIS — Z13220 Encounter for screening for lipoid disorders: Secondary | ICD-10-CM | POA: Diagnosis not present

## 2023-12-18 DIAGNOSIS — Z0000 Encounter for general adult medical examination without abnormal findings: Secondary | ICD-10-CM | POA: Diagnosis not present

## 2023-12-21 ENCOUNTER — Other Ambulatory Visit: Payer: Self-pay

## 2023-12-21 ENCOUNTER — Other Ambulatory Visit (HOSPITAL_COMMUNITY): Payer: Self-pay

## 2023-12-21 DIAGNOSIS — D229 Melanocytic nevi, unspecified: Secondary | ICD-10-CM | POA: Diagnosis not present

## 2023-12-21 DIAGNOSIS — K219 Gastro-esophageal reflux disease without esophagitis: Secondary | ICD-10-CM | POA: Diagnosis not present

## 2023-12-21 DIAGNOSIS — E559 Vitamin D deficiency, unspecified: Secondary | ICD-10-CM | POA: Diagnosis not present

## 2023-12-21 DIAGNOSIS — Z0001 Encounter for general adult medical examination with abnormal findings: Secondary | ICD-10-CM | POA: Diagnosis not present

## 2023-12-21 DIAGNOSIS — N952 Postmenopausal atrophic vaginitis: Secondary | ICD-10-CM | POA: Diagnosis not present

## 2023-12-21 DIAGNOSIS — I10 Essential (primary) hypertension: Secondary | ICD-10-CM | POA: Diagnosis not present

## 2023-12-21 MED ORDER — LOSARTAN POTASSIUM 25 MG PO TABS
25.0000 mg | ORAL_TABLET | Freq: Every day | ORAL | 3 refills | Status: AC
  Filled 2023-12-21: qty 90, 90d supply, fill #0
  Filled 2024-03-24: qty 90, 90d supply, fill #1

## 2023-12-21 MED ORDER — PANTOPRAZOLE SODIUM 40 MG PO TBEC
40.0000 mg | DELAYED_RELEASE_TABLET | Freq: Two times a day (BID) | ORAL | 3 refills | Status: AC
  Filled 2023-12-23: qty 180, 90d supply, fill #0
  Filled 2024-03-24: qty 180, 90d supply, fill #1

## 2023-12-21 MED ORDER — VITAMIN D 1.25 MG (50000 UT) PO CAPS
1.0000 | ORAL_CAPSULE | ORAL | 3 refills | Status: AC
  Filled 2023-12-21: qty 12, 84d supply, fill #0

## 2023-12-21 MED ORDER — PREMARIN 0.625 MG/GM VA CREA
1.0000 g | TOPICAL_CREAM | VAGINAL | 3 refills | Status: AC
  Filled 2023-12-21: qty 30, 70d supply, fill #0

## 2023-12-23 ENCOUNTER — Other Ambulatory Visit (HOSPITAL_COMMUNITY): Payer: Self-pay

## 2024-02-02 DIAGNOSIS — L821 Other seborrheic keratosis: Secondary | ICD-10-CM | POA: Diagnosis not present

## 2024-02-02 DIAGNOSIS — D229 Melanocytic nevi, unspecified: Secondary | ICD-10-CM | POA: Diagnosis not present

## 2024-02-22 ENCOUNTER — Other Ambulatory Visit (HOSPITAL_COMMUNITY): Payer: Self-pay

## 2024-02-24 ENCOUNTER — Other Ambulatory Visit (HOSPITAL_COMMUNITY): Payer: Self-pay

## 2024-02-24 MED ORDER — LOSARTAN POTASSIUM 25 MG PO TABS: 25.0000 mg | ORAL_TABLET | Freq: Every day | ORAL | 3 refills | Status: AC

## 2024-02-24 MED ORDER — VITAMIN D 1.25 MG (50000 UT) PO CAPS
1.2500 mg | ORAL_CAPSULE | ORAL | 3 refills | Status: AC
  Filled 2024-03-01: qty 12, 84d supply, fill #0

## 2024-02-24 MED ORDER — PANTOPRAZOLE SODIUM 40 MG PO TBEC: 40.0000 mg | DELAYED_RELEASE_TABLET | Freq: Two times a day (BID) | ORAL | 3 refills | Status: AC

## 2024-02-29 ENCOUNTER — Other Ambulatory Visit (HOSPITAL_COMMUNITY): Payer: Self-pay

## 2024-02-29 ENCOUNTER — Other Ambulatory Visit: Payer: Self-pay

## 2024-02-29 MED ORDER — IPRATROPIUM BROMIDE 0.06 % NA SOLN
1.0000 | Freq: Two times a day (BID) | NASAL | 2 refills | Status: AC
  Filled 2024-02-29: qty 15, 42d supply, fill #0

## 2024-03-01 ENCOUNTER — Other Ambulatory Visit (HOSPITAL_COMMUNITY): Payer: Self-pay

## 2024-03-24 ENCOUNTER — Other Ambulatory Visit: Payer: Self-pay

## 2024-07-27 ENCOUNTER — Encounter

## 2024-07-27 DIAGNOSIS — Z1231 Encounter for screening mammogram for malignant neoplasm of breast: Secondary | ICD-10-CM
